# Patient Record
Sex: Female | Born: 1940 | Race: White | Hispanic: No | Marital: Married | Smoking: Former smoker
Health system: Southern US, Community
[De-identification: ages and names within clinical notes are randomized; demographics above are authoritative.]

## PROBLEM LIST (undated history)

## (undated) DIAGNOSIS — I4891 Unspecified atrial fibrillation: Secondary | ICD-10-CM

## (undated) DIAGNOSIS — J449 Chronic obstructive pulmonary disease, unspecified: Secondary | ICD-10-CM

## (undated) DIAGNOSIS — I509 Heart failure, unspecified: Secondary | ICD-10-CM

---

## 1999-03-06 ENCOUNTER — Encounter: Admission: RE | Admit: 1999-03-06 | Discharge: 1999-03-06 | Payer: Self-pay | Admitting: Obstetrics and Gynecology

## 1999-03-06 ENCOUNTER — Encounter: Payer: Self-pay | Admitting: Obstetrics and Gynecology

## 1999-03-13 ENCOUNTER — Encounter: Payer: Self-pay | Admitting: Obstetrics and Gynecology

## 1999-03-13 ENCOUNTER — Encounter: Admission: RE | Admit: 1999-03-13 | Discharge: 1999-03-13 | Payer: Self-pay | Admitting: Obstetrics and Gynecology

## 2000-03-31 ENCOUNTER — Encounter: Payer: Self-pay | Admitting: Obstetrics and Gynecology

## 2000-03-31 ENCOUNTER — Encounter: Admission: RE | Admit: 2000-03-31 | Discharge: 2000-03-31 | Payer: Self-pay | Admitting: Obstetrics and Gynecology

## 2001-06-01 ENCOUNTER — Other Ambulatory Visit: Admission: RE | Admit: 2001-06-01 | Discharge: 2001-06-01 | Payer: Self-pay | Admitting: Obstetrics and Gynecology

## 2001-06-07 ENCOUNTER — Encounter: Payer: Self-pay | Admitting: Obstetrics and Gynecology

## 2001-06-07 ENCOUNTER — Encounter: Admission: RE | Admit: 2001-06-07 | Discharge: 2001-06-07 | Payer: Self-pay | Admitting: Obstetrics and Gynecology

## 2002-08-15 ENCOUNTER — Encounter: Admission: RE | Admit: 2002-08-15 | Discharge: 2002-08-15 | Payer: Self-pay | Admitting: Unknown Physician Specialty

## 2002-08-15 ENCOUNTER — Encounter: Payer: Self-pay | Admitting: Unknown Physician Specialty

## 2004-02-14 ENCOUNTER — Encounter: Admission: RE | Admit: 2004-02-14 | Discharge: 2004-02-14 | Payer: Self-pay | Admitting: Unknown Physician Specialty

## 2005-04-08 ENCOUNTER — Encounter: Admission: RE | Admit: 2005-04-08 | Discharge: 2005-04-08 | Payer: Self-pay | Admitting: Unknown Physician Specialty

## 2006-05-20 ENCOUNTER — Encounter: Admission: RE | Admit: 2006-05-20 | Discharge: 2006-05-20 | Payer: Self-pay | Admitting: Unknown Physician Specialty

## 2007-06-21 ENCOUNTER — Encounter: Admission: RE | Admit: 2007-06-21 | Discharge: 2007-06-21 | Payer: Self-pay | Admitting: Unknown Physician Specialty

## 2008-10-24 ENCOUNTER — Encounter: Admission: RE | Admit: 2008-10-24 | Discharge: 2008-10-24 | Payer: Self-pay | Admitting: Unknown Physician Specialty

## 2009-11-13 ENCOUNTER — Encounter: Admission: RE | Admit: 2009-11-13 | Discharge: 2009-11-13 | Payer: Self-pay | Admitting: Unknown Physician Specialty

## 2010-02-11 ENCOUNTER — Encounter: Payer: Self-pay | Admitting: Unknown Physician Specialty

## 2010-12-16 ENCOUNTER — Other Ambulatory Visit: Payer: Self-pay | Admitting: Unknown Physician Specialty

## 2010-12-16 DIAGNOSIS — Z1231 Encounter for screening mammogram for malignant neoplasm of breast: Secondary | ICD-10-CM

## 2010-12-16 DIAGNOSIS — M8580 Other specified disorders of bone density and structure, unspecified site: Secondary | ICD-10-CM

## 2010-12-17 ENCOUNTER — Ambulatory Visit
Admission: RE | Admit: 2010-12-17 | Discharge: 2010-12-17 | Disposition: A | Discharge status: Home / Self Care | Payer: Self-pay | Source: Ambulatory Visit | Attending: Unknown Physician Specialty | Admitting: Unknown Physician Specialty

## 2010-12-17 DIAGNOSIS — Z1231 Encounter for screening mammogram for malignant neoplasm of breast: Secondary | ICD-10-CM

## 2010-12-24 ENCOUNTER — Ambulatory Visit
Admission: RE | Admit: 2010-12-24 | Discharge: 2010-12-24 | Disposition: A | Discharge status: Home / Self Care | Payer: Self-pay | Source: Ambulatory Visit | Attending: Unknown Physician Specialty | Admitting: Unknown Physician Specialty

## 2010-12-24 DIAGNOSIS — M8580 Other specified disorders of bone density and structure, unspecified site: Secondary | ICD-10-CM

## 2011-12-26 ENCOUNTER — Other Ambulatory Visit: Payer: Self-pay | Admitting: Unknown Physician Specialty

## 2011-12-26 DIAGNOSIS — Z1231 Encounter for screening mammogram for malignant neoplasm of breast: Secondary | ICD-10-CM

## 2011-12-30 ENCOUNTER — Ambulatory Visit (INDEPENDENT_AMBULATORY_CARE_PROVIDER_SITE_OTHER): Payer: Medicare Other

## 2011-12-30 DIAGNOSIS — R928 Other abnormal and inconclusive findings on diagnostic imaging of breast: Secondary | ICD-10-CM

## 2011-12-30 DIAGNOSIS — Z1231 Encounter for screening mammogram for malignant neoplasm of breast: Secondary | ICD-10-CM

## 2012-01-06 ENCOUNTER — Other Ambulatory Visit: Payer: Self-pay | Admitting: Unknown Physician Specialty

## 2012-01-06 DIAGNOSIS — R928 Other abnormal and inconclusive findings on diagnostic imaging of breast: Secondary | ICD-10-CM

## 2012-03-10 ENCOUNTER — Ambulatory Visit
Admission: RE | Admit: 2012-03-10 | Discharge: 2012-03-10 | Disposition: A | Discharge status: Home / Self Care | Payer: Medicare Other | Source: Ambulatory Visit | Attending: Unknown Physician Specialty | Admitting: Unknown Physician Specialty

## 2012-03-10 DIAGNOSIS — R928 Other abnormal and inconclusive findings on diagnostic imaging of breast: Secondary | ICD-10-CM

## 2013-06-29 ENCOUNTER — Other Ambulatory Visit: Payer: Self-pay | Admitting: Unknown Physician Specialty

## 2013-06-29 DIAGNOSIS — Z0000 Encounter for general adult medical examination without abnormal findings: Secondary | ICD-10-CM

## 2013-06-30 ENCOUNTER — Ambulatory Visit (INDEPENDENT_AMBULATORY_CARE_PROVIDER_SITE_OTHER): Payer: Commercial Managed Care - HMO

## 2013-06-30 DIAGNOSIS — R928 Other abnormal and inconclusive findings on diagnostic imaging of breast: Secondary | ICD-10-CM | POA: Diagnosis not present

## 2013-06-30 DIAGNOSIS — Z0000 Encounter for general adult medical examination without abnormal findings: Secondary | ICD-10-CM

## 2013-07-01 ENCOUNTER — Other Ambulatory Visit: Payer: Self-pay | Admitting: Unknown Physician Specialty

## 2013-07-01 DIAGNOSIS — R928 Other abnormal and inconclusive findings on diagnostic imaging of breast: Secondary | ICD-10-CM

## 2013-07-14 ENCOUNTER — Encounter (INDEPENDENT_AMBULATORY_CARE_PROVIDER_SITE_OTHER): Payer: Self-pay

## 2013-07-14 ENCOUNTER — Ambulatory Visit
Admission: RE | Admit: 2013-07-14 | Discharge: 2013-07-14 | Disposition: A | Discharge status: Home / Self Care | Payer: Commercial Managed Care - HMO | Source: Ambulatory Visit | Attending: Unknown Physician Specialty | Admitting: Unknown Physician Specialty

## 2013-07-14 DIAGNOSIS — R928 Other abnormal and inconclusive findings on diagnostic imaging of breast: Secondary | ICD-10-CM

## 2013-12-05 ENCOUNTER — Other Ambulatory Visit: Payer: Self-pay | Admitting: Unknown Physician Specialty

## 2013-12-05 DIAGNOSIS — R05 Cough: Secondary | ICD-10-CM

## 2013-12-05 DIAGNOSIS — R059 Cough, unspecified: Secondary | ICD-10-CM

## 2013-12-06 ENCOUNTER — Ambulatory Visit: Payer: Commercial Managed Care - HMO

## 2013-12-06 ENCOUNTER — Other Ambulatory Visit: Payer: Self-pay | Admitting: Unknown Physician Specialty

## 2013-12-06 ENCOUNTER — Ambulatory Visit (INDEPENDENT_AMBULATORY_CARE_PROVIDER_SITE_OTHER): Payer: Commercial Managed Care - HMO

## 2013-12-06 DIAGNOSIS — R059 Cough, unspecified: Secondary | ICD-10-CM

## 2013-12-06 DIAGNOSIS — R05 Cough: Secondary | ICD-10-CM

## 2013-12-06 DIAGNOSIS — R062 Wheezing: Secondary | ICD-10-CM

## 2013-12-06 DIAGNOSIS — I517 Cardiomegaly: Secondary | ICD-10-CM

## 2013-12-06 DIAGNOSIS — R0602 Shortness of breath: Secondary | ICD-10-CM

## 2014-01-26 DIAGNOSIS — J449 Chronic obstructive pulmonary disease, unspecified: Secondary | ICD-10-CM | POA: Diagnosis not present

## 2014-01-30 DIAGNOSIS — J449 Chronic obstructive pulmonary disease, unspecified: Secondary | ICD-10-CM | POA: Diagnosis not present

## 2014-02-26 DIAGNOSIS — J449 Chronic obstructive pulmonary disease, unspecified: Secondary | ICD-10-CM | POA: Diagnosis not present

## 2014-03-02 DIAGNOSIS — J449 Chronic obstructive pulmonary disease, unspecified: Secondary | ICD-10-CM | POA: Diagnosis not present

## 2014-03-22 DIAGNOSIS — I482 Chronic atrial fibrillation: Secondary | ICD-10-CM | POA: Diagnosis not present

## 2014-03-22 DIAGNOSIS — J984 Other disorders of lung: Secondary | ICD-10-CM | POA: Diagnosis not present

## 2014-03-22 DIAGNOSIS — I5033 Acute on chronic diastolic (congestive) heart failure: Secondary | ICD-10-CM | POA: Diagnosis not present

## 2014-03-22 DIAGNOSIS — N179 Acute kidney failure, unspecified: Secondary | ICD-10-CM | POA: Diagnosis not present

## 2014-03-22 DIAGNOSIS — F064 Anxiety disorder due to known physiological condition: Secondary | ICD-10-CM | POA: Diagnosis not present

## 2014-03-22 DIAGNOSIS — J449 Chronic obstructive pulmonary disease, unspecified: Secondary | ICD-10-CM | POA: Diagnosis not present

## 2014-03-22 DIAGNOSIS — Z9981 Dependence on supplemental oxygen: Secondary | ICD-10-CM | POA: Diagnosis not present

## 2014-03-22 DIAGNOSIS — I480 Paroxysmal atrial fibrillation: Secondary | ICD-10-CM | POA: Diagnosis not present

## 2014-03-22 DIAGNOSIS — Z7951 Long term (current) use of inhaled steroids: Secondary | ICD-10-CM | POA: Diagnosis not present

## 2014-03-22 DIAGNOSIS — M8580 Other specified disorders of bone density and structure, unspecified site: Secondary | ICD-10-CM | POA: Diagnosis not present

## 2014-03-22 DIAGNOSIS — R918 Other nonspecific abnormal finding of lung field: Secondary | ICD-10-CM | POA: Diagnosis not present

## 2014-03-22 DIAGNOSIS — E119 Type 2 diabetes mellitus without complications: Secondary | ICD-10-CM | POA: Diagnosis not present

## 2014-03-22 DIAGNOSIS — I10 Essential (primary) hypertension: Secondary | ICD-10-CM | POA: Diagnosis not present

## 2014-03-22 DIAGNOSIS — E785 Hyperlipidemia, unspecified: Secondary | ICD-10-CM | POA: Diagnosis not present

## 2014-03-22 DIAGNOSIS — Z79899 Other long term (current) drug therapy: Secondary | ICD-10-CM | POA: Diagnosis not present

## 2014-03-22 DIAGNOSIS — D509 Iron deficiency anemia, unspecified: Secondary | ICD-10-CM | POA: Diagnosis not present

## 2014-03-22 DIAGNOSIS — E039 Hypothyroidism, unspecified: Secondary | ICD-10-CM | POA: Diagnosis not present

## 2014-03-22 DIAGNOSIS — E11649 Type 2 diabetes mellitus with hypoglycemia without coma: Secondary | ICD-10-CM | POA: Diagnosis not present

## 2014-03-22 DIAGNOSIS — I509 Heart failure, unspecified: Secondary | ICD-10-CM | POA: Diagnosis not present

## 2014-03-22 DIAGNOSIS — J90 Pleural effusion, not elsewhere classified: Secondary | ICD-10-CM | POA: Diagnosis not present

## 2014-03-22 DIAGNOSIS — N183 Chronic kidney disease, stage 3 (moderate): Secondary | ICD-10-CM | POA: Diagnosis not present

## 2014-03-22 DIAGNOSIS — Z90710 Acquired absence of both cervix and uterus: Secondary | ICD-10-CM | POA: Diagnosis not present

## 2014-03-22 DIAGNOSIS — K219 Gastro-esophageal reflux disease without esophagitis: Secondary | ICD-10-CM | POA: Diagnosis not present

## 2014-03-22 DIAGNOSIS — R0602 Shortness of breath: Secondary | ICD-10-CM | POA: Diagnosis not present

## 2014-03-22 DIAGNOSIS — J9621 Acute and chronic respiratory failure with hypoxia: Secondary | ICD-10-CM | POA: Diagnosis not present

## 2014-03-22 DIAGNOSIS — I272 Other secondary pulmonary hypertension: Secondary | ICD-10-CM | POA: Diagnosis not present

## 2014-03-22 DIAGNOSIS — R600 Localized edema: Secondary | ICD-10-CM | POA: Diagnosis not present

## 2014-03-22 DIAGNOSIS — I4891 Unspecified atrial fibrillation: Secondary | ICD-10-CM | POA: Diagnosis not present

## 2014-03-22 DIAGNOSIS — I130 Hypertensive heart and chronic kidney disease with heart failure and stage 1 through stage 4 chronic kidney disease, or unspecified chronic kidney disease: Secondary | ICD-10-CM | POA: Diagnosis not present

## 2014-03-22 DIAGNOSIS — I071 Rheumatic tricuspid insufficiency: Secondary | ICD-10-CM | POA: Diagnosis not present

## 2014-03-22 DIAGNOSIS — J189 Pneumonia, unspecified organism: Secondary | ICD-10-CM | POA: Diagnosis not present

## 2014-03-22 DIAGNOSIS — M1990 Unspecified osteoarthritis, unspecified site: Secondary | ICD-10-CM | POA: Diagnosis not present

## 2014-03-27 DIAGNOSIS — J449 Chronic obstructive pulmonary disease, unspecified: Secondary | ICD-10-CM | POA: Diagnosis not present

## 2014-03-31 DIAGNOSIS — J449 Chronic obstructive pulmonary disease, unspecified: Secondary | ICD-10-CM | POA: Diagnosis not present

## 2014-04-04 DIAGNOSIS — N183 Chronic kidney disease, stage 3 (moderate): Secondary | ICD-10-CM | POA: Diagnosis not present

## 2014-04-04 DIAGNOSIS — I10 Essential (primary) hypertension: Secondary | ICD-10-CM | POA: Diagnosis not present

## 2014-04-04 DIAGNOSIS — E039 Hypothyroidism, unspecified: Secondary | ICD-10-CM | POA: Diagnosis not present

## 2014-04-04 DIAGNOSIS — E780 Pure hypercholesterolemia: Secondary | ICD-10-CM | POA: Diagnosis not present

## 2014-04-04 DIAGNOSIS — J449 Chronic obstructive pulmonary disease, unspecified: Secondary | ICD-10-CM | POA: Diagnosis not present

## 2014-04-04 DIAGNOSIS — E119 Type 2 diabetes mellitus without complications: Secondary | ICD-10-CM | POA: Diagnosis not present

## 2014-04-04 DIAGNOSIS — Z9981 Dependence on supplemental oxygen: Secondary | ICD-10-CM | POA: Diagnosis not present

## 2014-04-04 DIAGNOSIS — D509 Iron deficiency anemia, unspecified: Secondary | ICD-10-CM | POA: Diagnosis not present

## 2014-04-13 DIAGNOSIS — I5030 Unspecified diastolic (congestive) heart failure: Secondary | ICD-10-CM | POA: Diagnosis not present

## 2014-04-13 DIAGNOSIS — R0602 Shortness of breath: Secondary | ICD-10-CM | POA: Diagnosis not present

## 2014-04-13 DIAGNOSIS — J449 Chronic obstructive pulmonary disease, unspecified: Secondary | ICD-10-CM | POA: Diagnosis not present

## 2014-04-13 DIAGNOSIS — G4733 Obstructive sleep apnea (adult) (pediatric): Secondary | ICD-10-CM | POA: Diagnosis not present

## 2014-04-24 DIAGNOSIS — G4733 Obstructive sleep apnea (adult) (pediatric): Secondary | ICD-10-CM | POA: Diagnosis not present

## 2014-04-27 DIAGNOSIS — J449 Chronic obstructive pulmonary disease, unspecified: Secondary | ICD-10-CM | POA: Diagnosis not present

## 2014-05-01 DIAGNOSIS — J449 Chronic obstructive pulmonary disease, unspecified: Secondary | ICD-10-CM | POA: Diagnosis not present

## 2014-05-01 DIAGNOSIS — I5032 Chronic diastolic (congestive) heart failure: Secondary | ICD-10-CM | POA: Diagnosis not present

## 2014-05-01 DIAGNOSIS — I482 Chronic atrial fibrillation: Secondary | ICD-10-CM | POA: Diagnosis not present

## 2014-05-01 DIAGNOSIS — I10 Essential (primary) hypertension: Secondary | ICD-10-CM | POA: Diagnosis not present

## 2014-05-01 DIAGNOSIS — J439 Emphysema, unspecified: Secondary | ICD-10-CM | POA: Diagnosis not present

## 2014-05-08 DIAGNOSIS — G4733 Obstructive sleep apnea (adult) (pediatric): Secondary | ICD-10-CM | POA: Diagnosis not present

## 2014-05-26 DIAGNOSIS — J439 Emphysema, unspecified: Secondary | ICD-10-CM | POA: Diagnosis not present

## 2014-05-26 DIAGNOSIS — I482 Chronic atrial fibrillation: Secondary | ICD-10-CM | POA: Diagnosis not present

## 2014-05-26 DIAGNOSIS — R0602 Shortness of breath: Secondary | ICD-10-CM | POA: Diagnosis not present

## 2014-05-26 DIAGNOSIS — I10 Essential (primary) hypertension: Secondary | ICD-10-CM | POA: Diagnosis not present

## 2014-05-27 DIAGNOSIS — J449 Chronic obstructive pulmonary disease, unspecified: Secondary | ICD-10-CM | POA: Diagnosis not present

## 2014-05-31 DIAGNOSIS — J449 Chronic obstructive pulmonary disease, unspecified: Secondary | ICD-10-CM | POA: Diagnosis not present

## 2014-06-27 DIAGNOSIS — J449 Chronic obstructive pulmonary disease, unspecified: Secondary | ICD-10-CM | POA: Diagnosis not present

## 2014-07-01 DIAGNOSIS — J449 Chronic obstructive pulmonary disease, unspecified: Secondary | ICD-10-CM | POA: Diagnosis not present

## 2014-07-19 DIAGNOSIS — R911 Solitary pulmonary nodule: Secondary | ICD-10-CM | POA: Diagnosis not present

## 2014-07-27 DIAGNOSIS — J449 Chronic obstructive pulmonary disease, unspecified: Secondary | ICD-10-CM | POA: Diagnosis not present

## 2014-07-31 DIAGNOSIS — J449 Chronic obstructive pulmonary disease, unspecified: Secondary | ICD-10-CM | POA: Diagnosis not present

## 2014-08-08 DIAGNOSIS — R0602 Shortness of breath: Secondary | ICD-10-CM | POA: Diagnosis not present

## 2014-08-08 DIAGNOSIS — J449 Chronic obstructive pulmonary disease, unspecified: Secondary | ICD-10-CM | POA: Diagnosis not present

## 2014-08-08 DIAGNOSIS — D649 Anemia, unspecified: Secondary | ICD-10-CM | POA: Diagnosis not present

## 2014-08-08 DIAGNOSIS — E119 Type 2 diabetes mellitus without complications: Secondary | ICD-10-CM | POA: Diagnosis not present

## 2014-08-10 DIAGNOSIS — Z23 Encounter for immunization: Secondary | ICD-10-CM | POA: Diagnosis not present

## 2014-08-10 DIAGNOSIS — I10 Essential (primary) hypertension: Secondary | ICD-10-CM | POA: Diagnosis not present

## 2014-08-10 DIAGNOSIS — E119 Type 2 diabetes mellitus without complications: Secondary | ICD-10-CM | POA: Diagnosis not present

## 2014-08-10 DIAGNOSIS — D509 Iron deficiency anemia, unspecified: Secondary | ICD-10-CM | POA: Diagnosis not present

## 2014-08-10 DIAGNOSIS — N183 Chronic kidney disease, stage 3 (moderate): Secondary | ICD-10-CM | POA: Diagnosis not present

## 2014-08-10 DIAGNOSIS — E780 Pure hypercholesterolemia: Secondary | ICD-10-CM | POA: Diagnosis not present

## 2014-08-10 DIAGNOSIS — E039 Hypothyroidism, unspecified: Secondary | ICD-10-CM | POA: Diagnosis not present

## 2014-08-25 ENCOUNTER — Other Ambulatory Visit: Payer: Self-pay | Admitting: Unknown Physician Specialty

## 2014-08-25 DIAGNOSIS — Z1231 Encounter for screening mammogram for malignant neoplasm of breast: Secondary | ICD-10-CM

## 2014-08-27 DIAGNOSIS — J449 Chronic obstructive pulmonary disease, unspecified: Secondary | ICD-10-CM | POA: Diagnosis not present

## 2014-08-30 ENCOUNTER — Ambulatory Visit (INDEPENDENT_AMBULATORY_CARE_PROVIDER_SITE_OTHER): Payer: Medicare Other

## 2014-08-30 DIAGNOSIS — Z1231 Encounter for screening mammogram for malignant neoplasm of breast: Secondary | ICD-10-CM | POA: Diagnosis not present

## 2014-08-31 DIAGNOSIS — J449 Chronic obstructive pulmonary disease, unspecified: Secondary | ICD-10-CM | POA: Diagnosis not present

## 2014-09-08 DIAGNOSIS — D649 Anemia, unspecified: Secondary | ICD-10-CM | POA: Diagnosis not present

## 2014-09-08 DIAGNOSIS — R0602 Shortness of breath: Secondary | ICD-10-CM | POA: Diagnosis not present

## 2014-09-08 DIAGNOSIS — J449 Chronic obstructive pulmonary disease, unspecified: Secondary | ICD-10-CM | POA: Diagnosis not present

## 2014-09-08 DIAGNOSIS — E119 Type 2 diabetes mellitus without complications: Secondary | ICD-10-CM | POA: Diagnosis not present

## 2014-09-27 DIAGNOSIS — J449 Chronic obstructive pulmonary disease, unspecified: Secondary | ICD-10-CM | POA: Diagnosis not present

## 2014-10-05 DIAGNOSIS — K449 Diaphragmatic hernia without obstruction or gangrene: Secondary | ICD-10-CM | POA: Diagnosis not present

## 2014-10-05 DIAGNOSIS — R590 Localized enlarged lymph nodes: Secondary | ICD-10-CM | POA: Diagnosis not present

## 2014-10-05 DIAGNOSIS — R911 Solitary pulmonary nodule: Secondary | ICD-10-CM | POA: Diagnosis not present

## 2014-10-05 DIAGNOSIS — J8489 Other specified interstitial pulmonary diseases: Secondary | ICD-10-CM | POA: Diagnosis not present

## 2014-10-09 DIAGNOSIS — J449 Chronic obstructive pulmonary disease, unspecified: Secondary | ICD-10-CM | POA: Diagnosis not present

## 2014-10-09 DIAGNOSIS — E119 Type 2 diabetes mellitus without complications: Secondary | ICD-10-CM | POA: Diagnosis not present

## 2014-10-09 DIAGNOSIS — D649 Anemia, unspecified: Secondary | ICD-10-CM | POA: Diagnosis not present

## 2014-10-09 DIAGNOSIS — R0602 Shortness of breath: Secondary | ICD-10-CM | POA: Diagnosis not present

## 2014-10-12 DIAGNOSIS — J449 Chronic obstructive pulmonary disease, unspecified: Secondary | ICD-10-CM | POA: Diagnosis not present

## 2014-10-12 DIAGNOSIS — R911 Solitary pulmonary nodule: Secondary | ICD-10-CM | POA: Diagnosis not present

## 2014-10-12 DIAGNOSIS — G4733 Obstructive sleep apnea (adult) (pediatric): Secondary | ICD-10-CM | POA: Diagnosis not present

## 2014-10-12 DIAGNOSIS — R0902 Hypoxemia: Secondary | ICD-10-CM | POA: Diagnosis not present

## 2014-10-27 DIAGNOSIS — J449 Chronic obstructive pulmonary disease, unspecified: Secondary | ICD-10-CM | POA: Diagnosis not present

## 2014-11-01 DIAGNOSIS — I5032 Chronic diastolic (congestive) heart failure: Secondary | ICD-10-CM | POA: Diagnosis not present

## 2014-11-01 DIAGNOSIS — J439 Emphysema, unspecified: Secondary | ICD-10-CM | POA: Diagnosis not present

## 2014-11-01 DIAGNOSIS — I482 Chronic atrial fibrillation: Secondary | ICD-10-CM | POA: Diagnosis not present

## 2014-11-01 DIAGNOSIS — I10 Essential (primary) hypertension: Secondary | ICD-10-CM | POA: Diagnosis not present

## 2014-11-27 DIAGNOSIS — J449 Chronic obstructive pulmonary disease, unspecified: Secondary | ICD-10-CM | POA: Diagnosis not present

## 2014-11-30 DIAGNOSIS — J441 Chronic obstructive pulmonary disease with (acute) exacerbation: Secondary | ICD-10-CM | POA: Diagnosis not present

## 2014-11-30 DIAGNOSIS — R0902 Hypoxemia: Secondary | ICD-10-CM | POA: Diagnosis not present

## 2014-11-30 DIAGNOSIS — G4733 Obstructive sleep apnea (adult) (pediatric): Secondary | ICD-10-CM | POA: Diagnosis not present

## 2014-11-30 DIAGNOSIS — I4891 Unspecified atrial fibrillation: Secondary | ICD-10-CM | POA: Diagnosis not present

## 2014-12-01 DIAGNOSIS — Z0000 Encounter for general adult medical examination without abnormal findings: Secondary | ICD-10-CM | POA: Diagnosis not present

## 2014-12-01 DIAGNOSIS — E039 Hypothyroidism, unspecified: Secondary | ICD-10-CM | POA: Diagnosis not present

## 2014-12-01 DIAGNOSIS — R828 Abnormal findings on cytological and histological examination of urine: Secondary | ICD-10-CM | POA: Diagnosis not present

## 2014-12-01 DIAGNOSIS — M899 Disorder of bone, unspecified: Secondary | ICD-10-CM | POA: Diagnosis not present

## 2014-12-01 DIAGNOSIS — Z862 Personal history of diseases of the blood and blood-forming organs and certain disorders involving the immune mechanism: Secondary | ICD-10-CM | POA: Diagnosis not present

## 2014-12-01 DIAGNOSIS — E7800 Pure hypercholesterolemia, unspecified: Secondary | ICD-10-CM | POA: Diagnosis not present

## 2014-12-01 DIAGNOSIS — I10 Essential (primary) hypertension: Secondary | ICD-10-CM | POA: Diagnosis not present

## 2014-12-01 DIAGNOSIS — Z23 Encounter for immunization: Secondary | ICD-10-CM | POA: Diagnosis not present

## 2014-12-01 DIAGNOSIS — M8580 Other specified disorders of bone density and structure, unspecified site: Secondary | ICD-10-CM | POA: Diagnosis not present

## 2014-12-19 DIAGNOSIS — J439 Emphysema, unspecified: Secondary | ICD-10-CM | POA: Diagnosis not present

## 2014-12-19 DIAGNOSIS — K5900 Constipation, unspecified: Secondary | ICD-10-CM | POA: Diagnosis not present

## 2014-12-19 DIAGNOSIS — I480 Paroxysmal atrial fibrillation: Secondary | ICD-10-CM | POA: Diagnosis not present

## 2014-12-27 DIAGNOSIS — J449 Chronic obstructive pulmonary disease, unspecified: Secondary | ICD-10-CM | POA: Diagnosis not present

## 2015-01-01 DIAGNOSIS — Z1212 Encounter for screening for malignant neoplasm of rectum: Secondary | ICD-10-CM | POA: Diagnosis not present

## 2015-01-01 DIAGNOSIS — Z1211 Encounter for screening for malignant neoplasm of colon: Secondary | ICD-10-CM | POA: Diagnosis not present

## 2015-01-19 DIAGNOSIS — J439 Emphysema, unspecified: Secondary | ICD-10-CM | POA: Diagnosis not present

## 2015-01-19 DIAGNOSIS — K5900 Constipation, unspecified: Secondary | ICD-10-CM | POA: Diagnosis not present

## 2015-01-19 DIAGNOSIS — I480 Paroxysmal atrial fibrillation: Secondary | ICD-10-CM | POA: Diagnosis not present

## 2015-01-19 DIAGNOSIS — R195 Other fecal abnormalities: Secondary | ICD-10-CM | POA: Diagnosis not present

## 2015-01-27 DIAGNOSIS — J449 Chronic obstructive pulmonary disease, unspecified: Secondary | ICD-10-CM | POA: Diagnosis not present

## 2015-01-30 DIAGNOSIS — Z780 Asymptomatic menopausal state: Secondary | ICD-10-CM | POA: Diagnosis not present

## 2015-02-09 DIAGNOSIS — D126 Benign neoplasm of colon, unspecified: Secondary | ICD-10-CM | POA: Diagnosis not present

## 2015-02-09 DIAGNOSIS — Z87891 Personal history of nicotine dependence: Secondary | ICD-10-CM | POA: Diagnosis not present

## 2015-02-09 DIAGNOSIS — E039 Hypothyroidism, unspecified: Secondary | ICD-10-CM | POA: Diagnosis not present

## 2015-02-09 DIAGNOSIS — I082 Rheumatic disorders of both aortic and tricuspid valves: Secondary | ICD-10-CM | POA: Diagnosis not present

## 2015-02-09 DIAGNOSIS — E785 Hyperlipidemia, unspecified: Secondary | ICD-10-CM | POA: Diagnosis not present

## 2015-02-09 DIAGNOSIS — D120 Benign neoplasm of cecum: Secondary | ICD-10-CM | POA: Diagnosis not present

## 2015-02-09 DIAGNOSIS — Z7951 Long term (current) use of inhaled steroids: Secondary | ICD-10-CM | POA: Diagnosis not present

## 2015-02-09 DIAGNOSIS — I4891 Unspecified atrial fibrillation: Secondary | ICD-10-CM | POA: Diagnosis not present

## 2015-02-09 DIAGNOSIS — K5730 Diverticulosis of large intestine without perforation or abscess without bleeding: Secondary | ICD-10-CM | POA: Diagnosis not present

## 2015-02-09 DIAGNOSIS — Z7901 Long term (current) use of anticoagulants: Secondary | ICD-10-CM | POA: Diagnosis not present

## 2015-02-09 DIAGNOSIS — I509 Heart failure, unspecified: Secondary | ICD-10-CM | POA: Diagnosis not present

## 2015-02-09 DIAGNOSIS — I10 Essential (primary) hypertension: Secondary | ICD-10-CM | POA: Diagnosis not present

## 2015-02-09 DIAGNOSIS — J449 Chronic obstructive pulmonary disease, unspecified: Secondary | ICD-10-CM | POA: Diagnosis not present

## 2015-02-09 DIAGNOSIS — K219 Gastro-esophageal reflux disease without esophagitis: Secondary | ICD-10-CM | POA: Diagnosis not present

## 2015-02-09 DIAGNOSIS — Z1211 Encounter for screening for malignant neoplasm of colon: Secondary | ICD-10-CM | POA: Diagnosis not present

## 2015-02-09 DIAGNOSIS — E119 Type 2 diabetes mellitus without complications: Secondary | ICD-10-CM | POA: Diagnosis not present

## 2015-02-09 DIAGNOSIS — K5900 Constipation, unspecified: Secondary | ICD-10-CM | POA: Diagnosis not present

## 2015-02-09 DIAGNOSIS — J439 Emphysema, unspecified: Secondary | ICD-10-CM | POA: Diagnosis not present

## 2015-02-13 DIAGNOSIS — J440 Chronic obstructive pulmonary disease with acute lower respiratory infection: Secondary | ICD-10-CM | POA: Diagnosis not present

## 2015-02-13 DIAGNOSIS — I110 Hypertensive heart disease with heart failure: Secondary | ICD-10-CM | POA: Diagnosis not present

## 2015-02-13 DIAGNOSIS — Z7951 Long term (current) use of inhaled steroids: Secondary | ICD-10-CM | POA: Diagnosis not present

## 2015-02-13 DIAGNOSIS — I2781 Cor pulmonale (chronic): Secondary | ICD-10-CM | POA: Diagnosis not present

## 2015-02-13 DIAGNOSIS — I5032 Chronic diastolic (congestive) heart failure: Secondary | ICD-10-CM | POA: Diagnosis not present

## 2015-02-13 DIAGNOSIS — Z9989 Dependence on other enabling machines and devices: Secondary | ICD-10-CM | POA: Diagnosis not present

## 2015-02-13 DIAGNOSIS — J441 Chronic obstructive pulmonary disease with (acute) exacerbation: Secondary | ICD-10-CM | POA: Diagnosis not present

## 2015-02-13 DIAGNOSIS — E039 Hypothyroidism, unspecified: Secondary | ICD-10-CM | POA: Diagnosis not present

## 2015-02-13 DIAGNOSIS — M6281 Muscle weakness (generalized): Secondary | ICD-10-CM | POA: Diagnosis not present

## 2015-02-13 DIAGNOSIS — J189 Pneumonia, unspecified organism: Secondary | ICD-10-CM | POA: Diagnosis not present

## 2015-02-13 DIAGNOSIS — Z79899 Other long term (current) drug therapy: Secondary | ICD-10-CM | POA: Diagnosis not present

## 2015-02-13 DIAGNOSIS — K219 Gastro-esophageal reflux disease without esophagitis: Secondary | ICD-10-CM | POA: Diagnosis not present

## 2015-02-13 DIAGNOSIS — I4891 Unspecified atrial fibrillation: Secondary | ICD-10-CM | POA: Diagnosis not present

## 2015-02-13 DIAGNOSIS — J811 Chronic pulmonary edema: Secondary | ICD-10-CM | POA: Diagnosis not present

## 2015-02-13 DIAGNOSIS — R918 Other nonspecific abnormal finding of lung field: Secondary | ICD-10-CM | POA: Diagnosis not present

## 2015-02-13 DIAGNOSIS — Z87891 Personal history of nicotine dependence: Secondary | ICD-10-CM | POA: Diagnosis not present

## 2015-02-13 DIAGNOSIS — J9622 Acute and chronic respiratory failure with hypercapnia: Secondary | ICD-10-CM | POA: Diagnosis not present

## 2015-02-13 DIAGNOSIS — D509 Iron deficiency anemia, unspecified: Secondary | ICD-10-CM | POA: Diagnosis not present

## 2015-02-13 DIAGNOSIS — J449 Chronic obstructive pulmonary disease, unspecified: Secondary | ICD-10-CM | POA: Diagnosis not present

## 2015-02-13 DIAGNOSIS — I509 Heart failure, unspecified: Secondary | ICD-10-CM | POA: Diagnosis not present

## 2015-02-13 DIAGNOSIS — I272 Other secondary pulmonary hypertension: Secondary | ICD-10-CM | POA: Diagnosis not present

## 2015-02-13 DIAGNOSIS — G4733 Obstructive sleep apnea (adult) (pediatric): Secondary | ICD-10-CM | POA: Diagnosis not present

## 2015-02-13 DIAGNOSIS — R0602 Shortness of breath: Secondary | ICD-10-CM | POA: Diagnosis not present

## 2015-02-13 DIAGNOSIS — G729 Myopathy, unspecified: Secondary | ICD-10-CM | POA: Diagnosis not present

## 2015-02-13 DIAGNOSIS — J9602 Acute respiratory failure with hypercapnia: Secondary | ICD-10-CM | POA: Diagnosis not present

## 2015-02-13 DIAGNOSIS — I5021 Acute systolic (congestive) heart failure: Secondary | ICD-10-CM | POA: Diagnosis not present

## 2015-02-13 DIAGNOSIS — J9621 Acute and chronic respiratory failure with hypoxia: Secondary | ICD-10-CM | POA: Diagnosis not present

## 2015-02-13 DIAGNOSIS — E871 Hypo-osmolality and hyponatremia: Secondary | ICD-10-CM | POA: Diagnosis not present

## 2015-02-13 DIAGNOSIS — Z9981 Dependence on supplemental oxygen: Secondary | ICD-10-CM | POA: Diagnosis not present

## 2015-02-13 DIAGNOSIS — K5900 Constipation, unspecified: Secondary | ICD-10-CM | POA: Diagnosis not present

## 2015-02-13 DIAGNOSIS — N179 Acute kidney failure, unspecified: Secondary | ICD-10-CM | POA: Diagnosis not present

## 2015-02-13 DIAGNOSIS — D649 Anemia, unspecified: Secondary | ICD-10-CM | POA: Diagnosis not present

## 2015-02-13 DIAGNOSIS — I071 Rheumatic tricuspid insufficiency: Secondary | ICD-10-CM | POA: Diagnosis not present

## 2015-02-13 DIAGNOSIS — E1165 Type 2 diabetes mellitus with hyperglycemia: Secondary | ICD-10-CM | POA: Diagnosis not present

## 2015-02-13 DIAGNOSIS — E119 Type 2 diabetes mellitus without complications: Secondary | ICD-10-CM | POA: Diagnosis not present

## 2015-02-13 DIAGNOSIS — R0600 Dyspnea, unspecified: Secondary | ICD-10-CM | POA: Diagnosis not present

## 2015-02-13 DIAGNOSIS — I517 Cardiomegaly: Secondary | ICD-10-CM | POA: Diagnosis not present

## 2015-03-04 DIAGNOSIS — I10 Essential (primary) hypertension: Secondary | ICD-10-CM | POA: Diagnosis not present

## 2015-03-04 DIAGNOSIS — E876 Hypokalemia: Secondary | ICD-10-CM | POA: Diagnosis not present

## 2015-03-04 DIAGNOSIS — I482 Chronic atrial fibrillation: Secondary | ICD-10-CM | POA: Diagnosis not present

## 2015-03-04 DIAGNOSIS — A419 Sepsis, unspecified organism: Secondary | ICD-10-CM | POA: Diagnosis not present

## 2015-03-04 DIAGNOSIS — E119 Type 2 diabetes mellitus without complications: Secondary | ICD-10-CM | POA: Diagnosis not present

## 2015-03-04 DIAGNOSIS — Y95 Nosocomial condition: Secondary | ICD-10-CM | POA: Diagnosis present

## 2015-03-04 DIAGNOSIS — Z9981 Dependence on supplemental oxygen: Secondary | ICD-10-CM | POA: Diagnosis not present

## 2015-03-04 DIAGNOSIS — G4733 Obstructive sleep apnea (adult) (pediatric): Secondary | ICD-10-CM | POA: Diagnosis not present

## 2015-03-04 DIAGNOSIS — I5033 Acute on chronic diastolic (congestive) heart failure: Secondary | ICD-10-CM | POA: Diagnosis not present

## 2015-03-04 DIAGNOSIS — L8992 Pressure ulcer of unspecified site, stage 2: Secondary | ICD-10-CM | POA: Diagnosis not present

## 2015-03-04 DIAGNOSIS — I4891 Unspecified atrial fibrillation: Secondary | ICD-10-CM | POA: Diagnosis not present

## 2015-03-04 DIAGNOSIS — J440 Chronic obstructive pulmonary disease with acute lower respiratory infection: Secondary | ICD-10-CM | POA: Diagnosis not present

## 2015-03-04 DIAGNOSIS — Z7951 Long term (current) use of inhaled steroids: Secondary | ICD-10-CM | POA: Diagnosis not present

## 2015-03-04 DIAGNOSIS — D638 Anemia in other chronic diseases classified elsewhere: Secondary | ICD-10-CM | POA: Diagnosis not present

## 2015-03-04 DIAGNOSIS — Z9989 Dependence on other enabling machines and devices: Secondary | ICD-10-CM | POA: Diagnosis not present

## 2015-03-04 DIAGNOSIS — J189 Pneumonia, unspecified organism: Secondary | ICD-10-CM | POA: Diagnosis not present

## 2015-03-04 DIAGNOSIS — D509 Iron deficiency anemia, unspecified: Secondary | ICD-10-CM | POA: Diagnosis not present

## 2015-03-04 DIAGNOSIS — I509 Heart failure, unspecified: Secondary | ICD-10-CM | POA: Diagnosis not present

## 2015-03-04 DIAGNOSIS — J9621 Acute and chronic respiratory failure with hypoxia: Secondary | ICD-10-CM | POA: Diagnosis not present

## 2015-03-04 DIAGNOSIS — I5031 Acute diastolic (congestive) heart failure: Secondary | ICD-10-CM | POA: Diagnosis not present

## 2015-03-04 DIAGNOSIS — Z7901 Long term (current) use of anticoagulants: Secondary | ICD-10-CM | POA: Diagnosis not present

## 2015-03-04 DIAGNOSIS — J449 Chronic obstructive pulmonary disease, unspecified: Secondary | ICD-10-CM | POA: Diagnosis not present

## 2015-03-04 DIAGNOSIS — Z66 Do not resuscitate: Secondary | ICD-10-CM | POA: Diagnosis not present

## 2015-03-04 DIAGNOSIS — E039 Hypothyroidism, unspecified: Secondary | ICD-10-CM | POA: Diagnosis not present

## 2015-03-04 DIAGNOSIS — Z8249 Family history of ischemic heart disease and other diseases of the circulatory system: Secondary | ICD-10-CM | POA: Diagnosis not present

## 2015-03-04 DIAGNOSIS — J9622 Acute and chronic respiratory failure with hypercapnia: Secondary | ICD-10-CM | POA: Diagnosis not present

## 2015-03-04 DIAGNOSIS — D649 Anemia, unspecified: Secondary | ICD-10-CM | POA: Diagnosis not present

## 2015-03-04 DIAGNOSIS — J441 Chronic obstructive pulmonary disease with (acute) exacerbation: Secondary | ICD-10-CM | POA: Diagnosis not present

## 2015-03-04 DIAGNOSIS — I5032 Chronic diastolic (congestive) heart failure: Secondary | ICD-10-CM | POA: Diagnosis not present

## 2015-03-04 DIAGNOSIS — Z79899 Other long term (current) drug therapy: Secondary | ICD-10-CM | POA: Diagnosis not present

## 2015-03-04 DIAGNOSIS — J439 Emphysema, unspecified: Secondary | ICD-10-CM | POA: Diagnosis not present

## 2015-03-04 DIAGNOSIS — M6281 Muscle weakness (generalized): Secondary | ICD-10-CM | POA: Diagnosis not present

## 2015-03-04 DIAGNOSIS — Z9119 Patient's noncompliance with other medical treatment and regimen: Secondary | ICD-10-CM | POA: Diagnosis not present

## 2015-03-04 DIAGNOSIS — R0602 Shortness of breath: Secondary | ICD-10-CM | POA: Diagnosis not present

## 2015-03-04 DIAGNOSIS — N179 Acute kidney failure, unspecified: Secondary | ICD-10-CM | POA: Diagnosis not present

## 2015-03-05 DIAGNOSIS — I5032 Chronic diastolic (congestive) heart failure: Secondary | ICD-10-CM | POA: Diagnosis not present

## 2015-03-05 DIAGNOSIS — E119 Type 2 diabetes mellitus without complications: Secondary | ICD-10-CM | POA: Diagnosis not present

## 2015-03-05 DIAGNOSIS — J449 Chronic obstructive pulmonary disease, unspecified: Secondary | ICD-10-CM | POA: Diagnosis not present

## 2015-03-05 DIAGNOSIS — I4891 Unspecified atrial fibrillation: Secondary | ICD-10-CM | POA: Diagnosis not present

## 2015-03-15 ENCOUNTER — Emergency Department (HOSPITAL_COMMUNITY): Payer: Medicare Other

## 2015-03-15 ENCOUNTER — Inpatient Hospital Stay (HOSPITAL_COMMUNITY)
Admission: EM | Admit: 2015-03-15 | Discharge: 2015-03-22 | DRG: 871 | Disposition: A | Discharge status: Home / Self Care | Payer: Medicare Other | Attending: Internal Medicine | Admitting: Internal Medicine

## 2015-03-15 ENCOUNTER — Encounter (HOSPITAL_COMMUNITY): Payer: Self-pay | Admitting: Emergency Medicine

## 2015-03-15 DIAGNOSIS — I509 Heart failure, unspecified: Secondary | ICD-10-CM

## 2015-03-15 DIAGNOSIS — A419 Sepsis, unspecified organism: Principal | ICD-10-CM | POA: Diagnosis present

## 2015-03-15 DIAGNOSIS — L8992 Pressure ulcer of unspecified site, stage 2: Secondary | ICD-10-CM | POA: Diagnosis not present

## 2015-03-15 DIAGNOSIS — I4891 Unspecified atrial fibrillation: Secondary | ICD-10-CM | POA: Diagnosis present

## 2015-03-15 DIAGNOSIS — Y95 Nosocomial condition: Secondary | ICD-10-CM | POA: Diagnosis present

## 2015-03-15 DIAGNOSIS — D649 Anemia, unspecified: Secondary | ICD-10-CM

## 2015-03-15 DIAGNOSIS — J439 Emphysema, unspecified: Secondary | ICD-10-CM

## 2015-03-15 DIAGNOSIS — K709 Alcoholic liver disease, unspecified: Secondary | ICD-10-CM | POA: Insufficient documentation

## 2015-03-15 DIAGNOSIS — Z7951 Long term (current) use of inhaled steroids: Secondary | ICD-10-CM | POA: Diagnosis not present

## 2015-03-15 DIAGNOSIS — Z66 Do not resuscitate: Secondary | ICD-10-CM | POA: Diagnosis not present

## 2015-03-15 DIAGNOSIS — J9621 Acute and chronic respiratory failure with hypoxia: Secondary | ICD-10-CM | POA: Diagnosis present

## 2015-03-15 DIAGNOSIS — I5033 Acute on chronic diastolic (congestive) heart failure: Secondary | ICD-10-CM | POA: Diagnosis not present

## 2015-03-15 DIAGNOSIS — D638 Anemia in other chronic diseases classified elsewhere: Secondary | ICD-10-CM | POA: Diagnosis not present

## 2015-03-15 DIAGNOSIS — J189 Pneumonia, unspecified organism: Secondary | ICD-10-CM

## 2015-03-15 DIAGNOSIS — J440 Chronic obstructive pulmonary disease with acute lower respiratory infection: Secondary | ICD-10-CM | POA: Diagnosis not present

## 2015-03-15 DIAGNOSIS — I10 Essential (primary) hypertension: Secondary | ICD-10-CM | POA: Diagnosis present

## 2015-03-15 DIAGNOSIS — Z7901 Long term (current) use of anticoagulants: Secondary | ICD-10-CM

## 2015-03-15 DIAGNOSIS — I482 Chronic atrial fibrillation: Secondary | ICD-10-CM

## 2015-03-15 DIAGNOSIS — N179 Acute kidney failure, unspecified: Secondary | ICD-10-CM | POA: Diagnosis not present

## 2015-03-15 DIAGNOSIS — G4733 Obstructive sleep apnea (adult) (pediatric): Secondary | ICD-10-CM | POA: Diagnosis not present

## 2015-03-15 DIAGNOSIS — N289 Disorder of kidney and ureter, unspecified: Secondary | ICD-10-CM | POA: Diagnosis not present

## 2015-03-15 DIAGNOSIS — Z86711 Personal history of pulmonary embolism: Secondary | ICD-10-CM | POA: Insufficient documentation

## 2015-03-15 DIAGNOSIS — Z9981 Dependence on supplemental oxygen: Secondary | ICD-10-CM | POA: Diagnosis not present

## 2015-03-15 DIAGNOSIS — J449 Chronic obstructive pulmonary disease, unspecified: Secondary | ICD-10-CM

## 2015-03-15 DIAGNOSIS — I5031 Acute diastolic (congestive) heart failure: Secondary | ICD-10-CM

## 2015-03-15 DIAGNOSIS — J9620 Acute and chronic respiratory failure, unspecified whether with hypoxia or hypercapnia: Secondary | ICD-10-CM

## 2015-03-15 DIAGNOSIS — R0602 Shortness of breath: Secondary | ICD-10-CM

## 2015-03-15 DIAGNOSIS — Z8249 Family history of ischemic heart disease and other diseases of the circulatory system: Secondary | ICD-10-CM

## 2015-03-15 DIAGNOSIS — Z9119 Patient's noncompliance with other medical treatment and regimen: Secondary | ICD-10-CM

## 2015-03-15 DIAGNOSIS — Z79899 Other long term (current) drug therapy: Secondary | ICD-10-CM | POA: Diagnosis not present

## 2015-03-15 DIAGNOSIS — E876 Hypokalemia: Secondary | ICD-10-CM | POA: Diagnosis not present

## 2015-03-15 DIAGNOSIS — L8990 Pressure ulcer of unspecified site, unspecified stage: Secondary | ICD-10-CM | POA: Insufficient documentation

## 2015-03-15 HISTORY — DX: Chronic obstructive pulmonary disease, unspecified: J44.9

## 2015-03-15 HISTORY — DX: Heart failure, unspecified: I50.9

## 2015-03-15 HISTORY — DX: Unspecified atrial fibrillation: I48.91

## 2015-03-15 LAB — I-STAT ARTERIAL BLOOD GAS, ED
Acid-Base Excess: 6 mmol/L — ABNORMAL HIGH (ref 0.0–2.0)
BICARBONATE: 29.8 meq/L — AB (ref 20.0–24.0)
O2 Saturation: 100 %
PO2 ART: 174 mmHg — AB (ref 80.0–100.0)
TCO2: 31 mmol/L (ref 0–100)
pCO2 arterial: 40.5 mmHg (ref 35.0–45.0)
pH, Arterial: 7.474 — ABNORMAL HIGH (ref 7.350–7.450)

## 2015-03-15 LAB — CBC WITH DIFFERENTIAL/PLATELET
BASOS ABS: 0 10*3/uL (ref 0.0–0.1)
Basophils Relative: 0 %
Eosinophils Absolute: 0.1 10*3/uL (ref 0.0–0.7)
Eosinophils Relative: 1 %
HCT: 25.7 % — ABNORMAL LOW (ref 36.0–46.0)
Hemoglobin: 7.9 g/dL — ABNORMAL LOW (ref 12.0–15.0)
LYMPHS ABS: 0.4 10*3/uL — AB (ref 0.7–4.0)
LYMPHS PCT: 7 %
MCH: 24.3 pg — ABNORMAL LOW (ref 26.0–34.0)
MCHC: 30.7 g/dL (ref 30.0–36.0)
MCV: 79.1 fL (ref 78.0–100.0)
MONO ABS: 0.3 10*3/uL (ref 0.1–1.0)
Monocytes Relative: 5 %
Neutro Abs: 5.1 10*3/uL (ref 1.7–7.7)
Neutrophils Relative %: 87 %
PLATELETS: 278 10*3/uL (ref 150–400)
RBC: 3.25 MIL/uL — AB (ref 3.87–5.11)
RDW: 24.1 % — AB (ref 11.5–15.5)
WBC: 5.9 10*3/uL (ref 4.0–10.5)

## 2015-03-15 LAB — COMPREHENSIVE METABOLIC PANEL
ALBUMIN: 2.7 g/dL — AB (ref 3.5–5.0)
ALT: 17 U/L (ref 14–54)
AST: 19 U/L (ref 15–41)
Alkaline Phosphatase: 48 U/L (ref 38–126)
Anion gap: 11 (ref 5–15)
BUN: 31 mg/dL — AB (ref 6–20)
CHLORIDE: 97 mmol/L — AB (ref 101–111)
CO2: 27 mmol/L (ref 22–32)
Calcium: 8.1 mg/dL — ABNORMAL LOW (ref 8.9–10.3)
Creatinine, Ser: 1.58 mg/dL — ABNORMAL HIGH (ref 0.44–1.00)
GFR calc Af Amer: 36 mL/min — ABNORMAL LOW (ref >60)
GFR calc non Af Amer: 31 mL/min — ABNORMAL LOW (ref >60)
GLUCOSE: 110 mg/dL — AB (ref 65–99)
POTASSIUM: 4.4 mmol/L (ref 3.5–5.1)
SODIUM: 135 mmol/L (ref 135–145)
Total Bilirubin: 0.8 mg/dL (ref 0.3–1.2)
Total Protein: 5.8 g/dL — ABNORMAL LOW (ref 6.5–8.1)

## 2015-03-15 LAB — STREP PNEUMONIAE URINARY ANTIGEN: Strep Pneumo Urinary Antigen: NEGATIVE

## 2015-03-15 LAB — ABO/RH: ABO/RH(D): A POS

## 2015-03-15 LAB — I-STAT TROPONIN, ED: Troponin i, poc: 0.03 ng/mL (ref 0.00–0.08)

## 2015-03-15 LAB — PREPARE RBC (CROSSMATCH)

## 2015-03-15 LAB — INFLUENZA PANEL BY PCR (TYPE A & B)
H1N1 flu by pcr: NOT DETECTED
INFLBPCR: NEGATIVE
Influenza A By PCR: NEGATIVE

## 2015-03-15 LAB — BRAIN NATRIURETIC PEPTIDE: B NATRIURETIC PEPTIDE 5: 303.1 pg/mL — AB (ref 0.0–100.0)

## 2015-03-15 LAB — MRSA PCR SCREENING: MRSA BY PCR: NEGATIVE

## 2015-03-15 MED ORDER — TRIAMCINOLONE ACETONIDE 55 MCG/ACT NA AERO: 2.0000 | INHALATION_SPRAY | NASAL | Status: DC | PRN

## 2015-03-15 MED ORDER — OSELTAMIVIR PHOSPHATE 30 MG PO CAPS
30.0000 mg | ORAL_CAPSULE | Freq: Two times a day (BID) | ORAL | Status: DC
  Administered 2015-03-15 (×2): 30 mg via ORAL
  Filled 2015-03-15 (×4): qty 1

## 2015-03-15 MED ORDER — ATORVASTATIN CALCIUM 10 MG PO TABS: 10.0000 mg | ORAL_TABLET | Freq: Every day | ORAL | Status: DC

## 2015-03-15 MED ORDER — DEXTROSE 5 % IV SOLN
2.0000 g | INTRAVENOUS | Status: DC
  Administered 2015-03-15 – 2015-03-19 (×5): 2 g via INTRAVENOUS
  Filled 2015-03-15 (×6): qty 2

## 2015-03-15 MED ORDER — DOCUSATE SODIUM 100 MG PO CAPS: 100.0000 mg | ORAL_CAPSULE | Freq: Every day | ORAL | Status: DC | PRN

## 2015-03-15 MED ORDER — IPRATROPIUM BROMIDE 0.02 % IN SOLN
0.5000 mg | Freq: Four times a day (QID) | RESPIRATORY_TRACT | Status: DC
  Administered 2015-03-15 – 2015-03-16 (×4): 0.5 mg via RESPIRATORY_TRACT
  Filled 2015-03-15 (×5): qty 2.5

## 2015-03-15 MED ORDER — DEXTROSE 5 % IV SOLN: 2.0000 g | INTRAVENOUS | Status: DC

## 2015-03-15 MED ORDER — METOPROLOL SUCCINATE ER 100 MG PO TB24
150.0000 mg | ORAL_TABLET | Freq: Every day | ORAL | Status: DC
  Administered 2015-03-16 – 2015-03-22 (×6): 150 mg via ORAL
  Filled 2015-03-15: qty 1
  Filled 2015-03-15: qty 2
  Filled 2015-03-15 (×2): qty 1
  Filled 2015-03-15: qty 2
  Filled 2015-03-15: qty 1
  Filled 2015-03-15: qty 2

## 2015-03-15 MED ORDER — ALBUTEROL SULFATE (2.5 MG/3ML) 0.083% IN NEBU: 2.5000 mg | INHALATION_SOLUTION | RESPIRATORY_TRACT | Status: DC | PRN

## 2015-03-15 MED ORDER — FAMOTIDINE 20 MG PO TABS
20.0000 mg | ORAL_TABLET | Freq: Every day | ORAL | Status: DC
  Administered 2015-03-15 – 2015-03-22 (×8): 20 mg via ORAL
  Filled 2015-03-15 (×8): qty 1

## 2015-03-15 MED ORDER — LEVOTHYROXINE SODIUM 75 MCG PO TABS
75.0000 ug | ORAL_TABLET | Freq: Every day | ORAL | Status: DC
  Administered 2015-03-16 – 2015-03-22 (×7): 75 ug via ORAL
  Filled 2015-03-15 (×8): qty 1

## 2015-03-15 MED ORDER — VITAMIN D 1000 UNITS PO TABS
5000.0000 [IU] | ORAL_TABLET | Freq: Every day | ORAL | Status: DC
  Administered 2015-03-15 – 2015-03-16 (×2): 5000 [IU] via ORAL
  Filled 2015-03-15 (×3): qty 5

## 2015-03-15 MED ORDER — LORATADINE 10 MG PO TABS
10.0000 mg | ORAL_TABLET | Freq: Every day | ORAL | Status: DC
  Administered 2015-03-15 – 2015-03-22 (×8): 10 mg via ORAL
  Filled 2015-03-15 (×8): qty 1

## 2015-03-15 MED ORDER — FUROSEMIDE 10 MG/ML IJ SOLN
60.0000 mg | Freq: Two times a day (BID) | INTRAMUSCULAR | Status: DC
  Administered 2015-03-15 – 2015-03-19 (×8): 60 mg via INTRAVENOUS
  Filled 2015-03-15 (×8): qty 6

## 2015-03-15 MED ORDER — TIOTROPIUM BROMIDE MONOHYDRATE 18 MCG IN CAPS
18.0000 ug | ORAL_CAPSULE | Freq: Every day | RESPIRATORY_TRACT | Status: DC
Start: 2015-03-16 — End: 2015-03-22
  Administered 2015-03-16 – 2015-03-22 (×7): 18 ug via RESPIRATORY_TRACT
  Filled 2015-03-15 (×3): qty 5

## 2015-03-15 MED ORDER — ATORVASTATIN CALCIUM 10 MG PO TABS
10.0000 mg | ORAL_TABLET | Freq: Every day | ORAL | Status: DC
  Administered 2015-03-15 – 2015-03-21 (×7): 10 mg via ORAL
  Filled 2015-03-15 (×7): qty 1

## 2015-03-15 MED ORDER — VANCOMYCIN HCL IN DEXTROSE 750-5 MG/150ML-% IV SOLN
750.0000 mg | INTRAVENOUS | Status: DC
  Administered 2015-03-16 – 2015-03-19 (×4): 750 mg via INTRAVENOUS
  Filled 2015-03-15 (×5): qty 150

## 2015-03-15 MED ORDER — BUDESONIDE-FORMOTEROL FUMARATE 160-4.5 MCG/ACT IN AERO
2.0000 | INHALATION_SPRAY | Freq: Two times a day (BID) | RESPIRATORY_TRACT | Status: DC
  Administered 2015-03-16 – 2015-03-22 (×12): 2 via RESPIRATORY_TRACT
  Filled 2015-03-15 (×3): qty 6

## 2015-03-15 MED ORDER — DILTIAZEM HCL ER 180 MG PO CP24
180.0000 mg | ORAL_CAPSULE | Freq: Every day | ORAL | Status: DC
  Administered 2015-03-16: 180 mg via ORAL
  Filled 2015-03-15 (×4): qty 1

## 2015-03-15 MED ORDER — ACETAMINOPHEN 500 MG PO TABS: 500.0000 mg | ORAL_TABLET | ORAL | Status: DC | PRN

## 2015-03-15 MED ORDER — ALBUTEROL SULFATE (2.5 MG/3ML) 0.083% IN NEBU
2.5000 mg | INHALATION_SOLUTION | Freq: Four times a day (QID) | RESPIRATORY_TRACT | Status: DC
  Administered 2015-03-15 – 2015-03-16 (×4): 2.5 mg via RESPIRATORY_TRACT
  Filled 2015-03-15 (×5): qty 3

## 2015-03-15 MED ORDER — ACETAMINOPHEN 500 MG PO TABS
1000.0000 mg | ORAL_TABLET | Freq: Once | ORAL | Status: AC
  Administered 2015-03-15: 1000 mg via ORAL
  Filled 2015-03-15: qty 2

## 2015-03-15 MED ORDER — FUROSEMIDE 10 MG/ML IJ SOLN
60.0000 mg | Freq: Once | INTRAMUSCULAR | Status: AC
  Administered 2015-03-15: 60 mg via INTRAVENOUS
  Filled 2015-03-15: qty 6

## 2015-03-15 MED ORDER — POLYSACCHARIDE IRON COMPLEX 150 MG PO CAPS
150.0000 mg | ORAL_CAPSULE | Freq: Two times a day (BID) | ORAL | Status: DC
  Administered 2015-03-15 – 2015-03-22 (×14): 150 mg via ORAL
  Filled 2015-03-15 (×14): qty 1

## 2015-03-15 MED ORDER — POLYETHYLENE GLYCOL 3350 17 G PO PACK
17.0000 g | PACK | Freq: Every day | ORAL | Status: DC
Start: 2015-03-15 — End: 2015-03-22
  Administered 2015-03-15 – 2015-03-22 (×5): 17 g via ORAL
  Filled 2015-03-15 (×7): qty 1

## 2015-03-15 MED ORDER — DEXTROSE 5 % IV SOLN
1.0000 g | Freq: Three times a day (TID) | INTRAVENOUS | Status: DC
  Filled 2015-03-15 (×3): qty 1

## 2015-03-15 MED ORDER — SODIUM CHLORIDE 0.9% FLUSH
3.0000 mL | Freq: Two times a day (BID) | INTRAVENOUS | Status: DC
  Administered 2015-03-16 – 2015-03-19 (×8): 3 mL via INTRAVENOUS

## 2015-03-15 MED ORDER — MELATONIN 3 MG PO TABS
4.5000 mg | ORAL_TABLET | Freq: Every day | ORAL | Status: DC
  Administered 2015-03-15 – 2015-03-20 (×6): 4.5 mg via ORAL
  Filled 2015-03-15 (×7): qty 1.5

## 2015-03-15 MED ORDER — APIXABAN 5 MG PO TABS
5.0000 mg | ORAL_TABLET | Freq: Two times a day (BID) | ORAL | Status: DC
  Administered 2015-03-15 – 2015-03-22 (×15): 5 mg via ORAL
  Filled 2015-03-15 (×16): qty 1

## 2015-03-15 MED ORDER — VANCOMYCIN HCL 10 G IV SOLR
1500.0000 mg | Freq: Once | INTRAVENOUS | Status: AC
  Administered 2015-03-15: 1500 mg via INTRAVENOUS
  Filled 2015-03-15: qty 1500

## 2015-03-15 MED ORDER — GUAIFENESIN ER 600 MG PO TB12
600.0000 mg | ORAL_TABLET | Freq: Two times a day (BID) | ORAL | Status: DC
  Administered 2015-03-15 – 2015-03-21 (×12): 600 mg via ORAL
  Filled 2015-03-15 (×12): qty 1

## 2015-03-15 MED ORDER — SODIUM CHLORIDE 0.9 % IV SOLN
Freq: Once | INTRAVENOUS | Status: AC
  Administered 2015-03-15: 12:00:00 via INTRAVENOUS

## 2015-03-15 NOTE — ED Notes (Signed)
Attempted report 

## 2015-03-15 NOTE — ED Notes (Signed)
Attempted to wean the patient down to 4L via nasal cannula, pt desated to 88%. Pt placed back on NRB mask at 13L/min.

## 2015-03-15 NOTE — H&P (Addendum)
Patient Demographics  Brittany Snow, is a 75 y.o. female  MRN: [id number]   DOB - 02/02/1940  Admit Date - 03/15/2015  Outpatient Primary MD for the patient is No primary care provider on file.   With History of -  Past Medical History  Diagnosis Date  . COPD (chronic obstructive pulmonary disease) ([place])   . CHF (congestive heart failure) ([place])   . Atrial fibrillation ([place])       History reviewed. No pertinent past surgical history.  in for   Chief Complaint  Patient presents with  . Shortness of Breath     HPI  Brittany Snow  is a 75 y.o. female, with past medical history of COPD, on 3 L nasal cannula, history of atrial fibrillation  on Eliquis, OSA noncompliant with C Pap, diastolic CHF, chronic anemia, recent hospitalization at [facility] for respiratory failure secondary to COPD exacerbation, CHF exacerbation, and pneumonia, discharged to SNF, at facility patient was noticed to have hypoxia, as well she reports productive cough, green phlegm over the last few days, in ED patient requiring NRB, fever 101.3, no leukocytosis, his x-ray significant for multiple opacities significant for PE versus pneumonia, known baseline anemia with hemoglobin of 7.9(was 7.4 on discharge from [place] on 2/12), was an A. fib with heart rate in 110s, she reports worsening lower extremity edema over few days as well, received IV Lasix in ED, and I was called to admit.    Review of Systems    In addition to the HPI above,  Reports fever and chills No Headache, No changes with Vision or hearing, No problems swallowing food or Liquids, Denies chest pain, complains of productive cough with worsening dyspnea  No Abdominal pain, No Nausea or Vommitting, Bowel movements are regular, No Blood in stool or Urine, No dysuria, No new skin rashes or bruises, No new joints pains-aches,  No new weakness, tingling, numbness in any extremity, No recent weight gain or loss, No polyuria, polydypsia or  polyphagia, No significant Mental Stressors.  A full 10 point Review of Systems was done, except as stated above, all other Review of Systems were negative.   Social History Social History  Substance Use Topics  . Smoking status: Not on file  . Smokeless tobacco: Not on file  . Alcohol Use: Not on file   Family History Family history significant for hypertension  Prior to Admission medications   Medication Sig Start Date End Date Taking? Authorizing Provider  acetaminophen (TYLENOL) 500 MG tablet Take 500 mg by mouth every 4 (four) hours as needed for mild pain or headache.   Yes Historical Provider, MD  albuterol (PROVENTIL HFA;VENTOLIN HFA) 108 (90 Base) MCG/ACT inhaler Inhale 2 puffs into the lungs every 4 (four) hours as needed for wheezing or shortness of breath.   Yes Historical Provider, MD  apixaban (ELIQUIS) 5 MG TABS tablet Take 5 mg by mouth 2 (two) times daily.   Yes Historical Provider, MD  atorvastatin (LIPITOR) 10 MG tablet Take 10 mg by mouth daily.   Yes Historical Provider, MD  budesonide-formoterol (SYMBICORT) 160-4.5 MCG/ACT inhaler Inhale 2 puffs into the lungs 2 (two) times daily.   Yes Historical Provider, MD  cetirizine (ZYRTEC) 10 MG tablet Take 10 mg by mouth as needed for allergies.   Yes Historical Provider, MD  Cholecalciferol (VITAMIN D3) 5000 units TABS Take 5,000 Units by mouth daily.   Yes Historical Provider, MD  diltiazem (DILACOR XR) 180 MG 24 hr capsule Take  180 mg by mouth daily.   Yes Historical Provider, MD  docusate sodium (COLACE) 100 MG capsule Take 100 mg by mouth daily as needed for mild constipation.   Yes Historical Provider, MD  guaiFENesin (MUCINEX) 600 MG 12 hr tablet Take 600 mg by mouth 2 (two) times daily.   Yes Historical Provider, MD  iron polysaccharides (NIFEREX) 150 MG capsule Take 150 mg by mouth 2 (two) times daily.   Yes Historical Provider, MD  levothyroxine (SYNTHROID, LEVOTHROID) 75 MCG tablet Take 75 mcg by mouth daily  before breakfast.   Yes Historical Provider, MD  Melatonin 5 MG TABS Take 5 mg by mouth at bedtime.   Yes Historical Provider, MD  metoprolol succinate (TOPROL-XL) 100 MG 24 hr tablet Take 150 mg by mouth daily. Take with or immediately following a meal.   Yes Historical Provider, MD  polyethylene glycol (MIRALAX / GLYCOLAX) packet Take 17 g by mouth daily.   Yes Historical Provider, MD  ranitidine (ZANTAC) 300 MG tablet Take 300 mg by mouth daily.   Yes Historical Provider, MD  tiotropium (SPIRIVA) 18 MCG inhalation capsule Place 18 mcg into inhaler and inhale daily.   Yes Historical Provider, MD  torsemide (DEMADEX) 20 MG tablet Take 40 mg by mouth daily.   Yes Historical Provider, MD  triamcinolone (NASACORT ALLERGY 24HR) 55 MCG/ACT AERO nasal inhaler Place 2 sprays into the nose as needed (for allergies).   Yes Historical Provider, MD    No Known Allergies  Physical Exam  Vitals  Blood pressure 95/50, pulse 98, temperature 101.3 F (38.5 C), temperature source Rectal, resp. rate 21, SpO2 100 %.   1. General frail elderly female lying in bed in NAD,   2. Normal affect and insight, Not Suicidal or Homicidal, Awake Alert, Oriented X 3.  3. No F.N deficits, ALL C.Nerves Intact, has generalized weakness, but motor is grossly intact, Sensation intact all 4 extremities, Plantars down going.  4. Ears and Eyes appear Normal, Conjunctivae clear, PERRLA. Moist Oral Mucosa.  5. Supple Neck,+ JVD, No Carotid Bruits.  6. Symmetrical Chest wall movement, Sackrider air movement bilaterally, no wheezing, bibasilar crackles  7. Irregular irregular, No Gallops, Rubs or Murmurs, No Parasternal Heave, lower extremity edema+2-3  8. Positive Bowel Sounds, Abdomen Soft, No tenderness, No organomegaly appriciated,No rebound -guarding or rigidity.  9.  No Cyanosis, Normal Skin Turgor, No Skin Rash or Bruise.  10. [place] muscle tone,  joints appear normal , no effusions, Normal ROM.     Data  Review  CBC  Recent Labs Lab 03/15/15 0647  WBC 5.9  HGB 7.9*  HCT 25.7*  PLT 278  MCV 79.1  MCH 24.3*  MCHC 30.7  RDW 24.1*  LYMPHSABS 0.4*  MONOABS 0.3  EOSABS 0.1  BASOSABS 0.0   ------------------------------------------------------------------------------------------------------------------  Chemistries   Recent Labs Lab 03/15/15 0647  NA 135  K 4.4  CL 97*  CO2 27  GLUCOSE 110*  BUN 31*  CREATININE 1.58*  CALCIUM 8.1*  AST 19  ALT 17  ALKPHOS 48  BILITOT 0.8   ------------------------------------------------------------------------------------------------------------------ CrCl cannot be calculated (Unknown ideal weight.). ------------------------------------------------------------------------------------------------------------------ No results for input(s): TSH, T4TOTAL, T3FREE, THYROIDAB in the last 72 hours.  Invalid input(s): FREET3   Coagulation profile No results for input(s): INR, PROTIME in the last 168 hours. ------------------------------------------------------------------------------------------------------------------- No results for input(s): DDIMER in the last 72 hours. -------------------------------------------------------------------------------------------------------------------  Cardiac Enzymes No results for input(s): CKMB, TROPONINI, MYOGLOBIN in the last 168 hours.  Invalid input(s): CK ------------------------------------------------------------------------------------------------------------------  Invalid input(s): POCBNP   ---------------------------------------------------------------------------------------------------------------  Urinalysis No results found for: COLORURINE, APPEARANCEUR, LABSPEC, [place], GLUCOSEU, HGBUR, BILIRUBINUR, KETONESUR, PROTEINUR, UROBILINOGEN, NITRITE,  LEUKOCYTESUR  ----------------------------------------------------------------------------------------------------------------  Imaging results:   Dg Chest Port 1 View  03/15/2015  CLINICAL DATA:  Acute onset of worsening shortness of breath and severe generalized weakness. Initial encounter. EXAM: PORTABLE CHEST 1 VIEW COMPARISON:  Chest radiograph performed 12/06/2013 FINDINGS: The lungs are relatively well expanded. Patchy bibasilar airspace opacities may reflect pulmonary edema or pneumonia. No definite pleural effusion or pneumothorax is seen. Vascular congestion is noted. The cardiomediastinal silhouette is mildly enlarged. No acute osseous abnormalities are identified. IMPRESSION: Vascular congestion and mild cardiomegaly. Patchy bibasilar airspace opacities may reflect pulmonary edema or pneumonia. Electronically Signed   By: Garald Balding M.D.   On: 03/15/2015 07:02    My personal review of EKG: Rhythm NSR, Rate  104 /min, QTc 343 , no Acute ST changes    Assessment & Plan  Principal Problem:   Acute on chronic respiratory failure (HCC) Active Problems:   COPD (chronic obstructive pulmonary disease) (HCC)   Acute diastolic CHF (congestive heart failure) (HCC)   HCAP (healthcare-associated pneumonia)   Atrial fibrillation (HCC)   Anemia  Acute on chronic respiratory failure - Patient with baseline chronic respiratory failure on 3 L nasal cannula secondary to COPD, with a critical position secondary to acute diastolic CHF, infectious process most likely HCAP versus viral illness. - Currently requiring NRB, will obtain ABG, will M for saturation 90-93%, [place] mentation, do not anticipate need for BiPAP. - Continue with IV diuresis and treatment of HCAP.  Acute diastolic CHF - Recent echo at [facility] with EF 0000000, with diastolic dysfunction, moderate hypertension. - We'll start on IV diuresis 60 mg IV twice a day, hold home dose torsemide - Daily weights, strict ins  and outs - CHF order set on admission.  HCAP - Chest x-ray with bilateral opacities, edema versus infection, she is febrile, will start on vancomycin and cefepime, pneumonia order set on admission. - We'll check influenza panel given her fever, will start empirically on Tamiflu until influenza is ruled out.  Anemia - Anemia of chronic illness, hemoglobin at baseline, but given she is symptomatic with hypoxia, dyspnea, will transfuse 1 unit PRBC.  Atrial fibrillation - Patient is on Eliquis for anticoagulation - Resume back on Cardizem and metoprolol for heart rate control, slightly elevated in ED, the blood pressure becomes an issue may consider amiodarone and digoxin loading bolus.  Acute renal failure - Baseline creatinine 1.1, today is 1.58, will monitor closely as she is on IV diuresis.  COPD - Patient with no active wheezing, will wait ABG, continue with home medication including Spiriva, Symbicort, nebulizer treatment, and on steroid treatment currently, as her respiratory failures appears to be most due to infectious process and volume overload.  DVT Prophylaxis Eliquis  AM Labs Ordered, also please review Full Orders  Family Communication: Admission, patients condition and plan of care including tests being ordered have been discussed with the patient and Husband who indicate understanding and agree with the plan and Code Status.  Code Status : She is full code , confirmed by patient and husband(she has a DO NOT RESUSCITATE form from facility, they want to keep her full code during this hospital stay)  Likely DC to  SNF  Condition GUARDED   Time spent in minutes : 65 minutes    Sears Oran M.D on 03/15/2015 at 9:34 AM  Between 7am to 7pm -  Pager - [phone]  After 7pm go to [email or web address] - password [email or web address]  And look for the night coverage person covering me after hours  [facility] Office  [phone]

## 2015-03-15 NOTE — ED Notes (Signed)
MD at bedside. 

## 2015-03-15 NOTE — ED Notes (Signed)
Pt placed on venti mask at 8L/min. Pt O2 saturation at 92% at this time.

## 2015-03-15 NOTE — ED Notes (Signed)
Pt stating she feels anxious and that her chest is tight, EKG captured and will page admitting MD. Pt reports feeling she cant breathe, placed on NRB mask at 15L/min for the time being to calm patient. Pt repositioned and reports she feels some better.

## 2015-03-15 NOTE — ED Provider Notes (Signed)
Patient was recently diagnosed with double pneumonia. Treated for several weeks with antibiotics. She's been in rehabilitation for the last week. The past 2 days she's had increasing shortness of breath. She denies any cough. Shortness of breath is worse with lying flat and with any exertion. No fever or chills. Patient noted to be hypoxic on 4 L. She is now on a nonrebreather. She denies any chest pain. She endorses bilateral lower extremity swelling or worsening of the last few days. Patient is in no respiratory distress. She has bilateral crackles as well as 3+ pitting edema in the lower extremities. Exam is consistent with CHF exacerbation. Will start on IV Lasix and gets x-ray and labs. At this time the patient is maintaining oxygen saturations. She has any worsening respiratory status she will need to be on BiPAP.  Julianne Rice, MD 03/15/15 [phone]

## 2015-03-15 NOTE — ED Notes (Signed)
PER [facility]: Patient to ED from [facility] in [place] c/o worsened SOB x 2 days. Patient has hx of CHF, COPD, and A-Fib, is normally on 3L O2 via [place] maintaining SpO2 around 91%. Per EMS, nursing staff reports patient has been down to 88% on 4L. Patient was diagnosed with bilateral pneumonia 2 weeks ago, pt states she's finished all the medications for that. EMS VS: HR 104 A-Fib, 120/50, 96% on 13L NRB, 20g. L wrist. Patient A&O x 4, respirations equal, labored, lung sounds clear/diminished.

## 2015-03-15 NOTE — Progress Notes (Signed)
Pharmacy Antibiotic Note Brittany Snow is a 75 y.o. female admitted from SNF on 03/15/2015 with worsening SOB and concern for PNA vs CHF. Recent hospitalization at OSH for similar early this month. At that time SCr 1.1; today it is 1.58. Pharmacy has been consulted for Cefepime and vancomycin dosing.  Plan: 1. Cefepime 2 grams IV Q 24 hours starting this morning 2. Vancomycin 1500 mg IV x 1 now followed by vancomycin 750 mg IV q 24 hours starting on 2/24 3. If continued will obtain level at [facility]; goal 15 - 20 4. F/u cx data, clinical response and other pertinent labs/test and narrow abx as feasible 5. Following along with you daily  Weight: 175 lb 4.3 oz (79.5 kg)  Temp (24hrs), Avg:100.4 F (38 C), Min:99.5 F (37.5 C), Max:101.3 F (38.5 C)   Recent Labs Lab 03/15/15 0647  WBC 5.9  CREATININE 1.58*     No Known Allergies  Antimicrobials this admission: 2/23 Cefepime >>  2/23 Vancomycin >>  Dose adjustments this admission: n/a  Microbiology results: px  Thank you for allowing pharmacy to be a part of this patient's care.  Vincenza Hews, PharmD, BCPS 03/15/2015, 10:23 AM Pager: [phone]

## 2015-03-15 NOTE — ED Provider Notes (Addendum)
CSN: [id number]     Arrival date & time 03/15/15  0620 History   First MD Initiated Contact with Patient 03/15/15 [phone]     Chief Complaint  Patient presents with  . Shortness of Breath     (Consider location/radiation/quality/duration/timing/severity/associated sxs/prior Treatment) HPI..... Level V caveat for urgent need for intervention. Patient was recently diagnosed with "double pneumonia" for which she was treated with antibiotics and admitted to [facility] for three weeks and ultimately transferred  to a rehabilitation center. She has had increasing dyspnea over the last couple days along with lower extremity edema. She was hypoxic on 4 L of nasal cannula and a nonrebreather was ordered. No chest pain, fevers, sweats, chills.  Past Medical History  Diagnosis Date  . COPD (chronic obstructive pulmonary disease) ([place])   . CHF (congestive heart failure) ([place])   . Atrial fibrillation ([place])    History reviewed. No pertinent past surgical history. No family history on file. Social History  Substance Use Topics  . Smoking status: None  . Smokeless tobacco: None  . Alcohol Use: None   OB History    No data available     Review of Systems  Reason unable to perform ROS: Urgent need for intervention.      Allergies  Review of patient's allergies indicates no known allergies.  Home Medications   Prior to Admission medications   Medication Sig Start Date End Date Taking? Authorizing Provider  acetaminophen (TYLENOL) 500 MG tablet Take 500 mg by mouth every 4 (four) hours as needed for mild pain or headache.   Yes Historical Provider, MD  albuterol (PROVENTIL HFA;VENTOLIN HFA) 108 (90 Base) MCG/ACT inhaler Inhale 2 puffs into the lungs every 4 (four) hours as needed for wheezing or shortness of breath.   Yes Historical Provider, MD  apixaban (ELIQUIS) 5 MG TABS tablet Take 5 mg by mouth 2 (two) times daily.   Yes Historical Provider, MD  atorvastatin (LIPITOR) 10 MG tablet  Take 10 mg by mouth daily.   Yes Historical Provider, MD  budesonide-formoterol (SYMBICORT) 160-4.5 MCG/ACT inhaler Inhale 2 puffs into the lungs 2 (two) times daily.   Yes Historical Provider, MD  cetirizine (ZYRTEC) 10 MG tablet Take 10 mg by mouth as needed for allergies.   Yes Historical Provider, MD  Cholecalciferol (VITAMIN D3) 5000 units TABS Take 5,000 Units by mouth daily.   Yes Historical Provider, MD  diltiazem (DILACOR XR) 180 MG 24 hr capsule Take 180 mg by mouth daily.   Yes Historical Provider, MD  docusate sodium (COLACE) 100 MG capsule Take 100 mg by mouth daily as needed for mild constipation.   Yes Historical Provider, MD  guaiFENesin (MUCINEX) 600 MG 12 hr tablet Take 600 mg by mouth 2 (two) times daily.   Yes Historical Provider, MD  iron polysaccharides (NIFEREX) 150 MG capsule Take 150 mg by mouth 2 (two) times daily.   Yes Historical Provider, MD  levothyroxine (SYNTHROID, LEVOTHROID) 75 MCG tablet Take 75 mcg by mouth daily before breakfast.   Yes Historical Provider, MD  Melatonin 5 MG TABS Take 5 mg by mouth at bedtime.   Yes Historical Provider, MD  metoprolol succinate (TOPROL-XL) 100 MG 24 hr tablet Take 150 mg by mouth daily. Take with or immediately following a meal.   Yes Historical Provider, MD  polyethylene glycol (MIRALAX / GLYCOLAX) packet Take 17 g by mouth daily.   Yes Historical Provider, MD  ranitidine (ZANTAC) 300 MG tablet Take 300 mg by mouth  daily.   Yes Historical Provider, MD  tiotropium (SPIRIVA) 18 MCG inhalation capsule Place 18 mcg into inhaler and inhale daily.   Yes Historical Provider, MD  torsemide (DEMADEX) 20 MG tablet Take 40 mg by mouth daily.   Yes Historical Provider, MD  triamcinolone (NASACORT ALLERGY 24HR) 55 MCG/ACT AERO nasal inhaler Place 2 sprays into the nose as needed (for allergies).   Yes Historical Provider, MD   BP 109/57 mmHg  Pulse 104  Temp(Src) 99.5 F (37.5 C) (Oral)  Resp 28  SpO2 100% Physical Exam   Constitutional: She is oriented to person, place, and time.  Obese, no obvious respiratory distress on nonrebreather  HENT:  Head: Normocephalic and atraumatic.  Eyes: Conjunctivae and EOM are normal. Pupils are equal, round, and reactive to light.  Neck: Normal range of motion. Neck supple.  Cardiovascular: Normal rate and regular rhythm.   Pulmonary/Chest: Effort normal and breath sounds normal.  Abdominal: Soft. Bowel sounds are normal.  Musculoskeletal: Normal range of motion.  Neurological: She is alert and oriented to person, place, and time.  Skin: Skin is warm and dry.  3+ peripheral edema  Psychiatric: She has a normal mood and affect. Her behavior is normal.  Nursing note and vitals reviewed.   ED Course  Procedures (including critical care time) Labs Review Labs Reviewed  CBC WITH DIFFERENTIAL/PLATELET - Abnormal; Notable for the following:    RBC 3.25 (*)    Hemoglobin 7.9 (*)    HCT 25.7 (*)    MCH 24.3 (*)    RDW 24.1 (*)    All other components within normal limits  COMPREHENSIVE METABOLIC PANEL - Abnormal; Notable for the following:    Chloride 97 (*)    Glucose, Bld 110 (*)    BUN 31 (*)    Creatinine, Ser 1.58 (*)    Calcium 8.1 (*)    Total Protein 5.8 (*)    Albumin 2.7 (*)    GFR calc non Af Amer 31 (*)    GFR calc Af Amer 36 (*)    All other components within normal limits  BRAIN NATRIURETIC PEPTIDE  I-STAT TROPOININ, ED    Imaging Review Dg Chest Port 1 View  03/15/2015  CLINICAL DATA:  Acute onset of worsening shortness of breath and severe generalized weakness. Initial encounter. EXAM: PORTABLE CHEST 1 VIEW COMPARISON:  Chest radiograph performed 12/06/2013 FINDINGS: The lungs are relatively well expanded. Patchy bibasilar airspace opacities may reflect pulmonary edema or pneumonia. No definite pleural effusion or pneumothorax is seen. Vascular congestion is noted. The cardiomediastinal silhouette is mildly enlarged. No acute osseous  abnormalities are identified. IMPRESSION: Vascular congestion and mild cardiomegaly. Patchy bibasilar airspace opacities may reflect pulmonary edema or pneumonia. Electronically Signed   By: Garald Balding M.D.   On: 03/15/2015 07:02   I have personally reviewed and evaluated these images and lab results as part of my medical decision-making.   EKG Interpretation None     CRITICAL CARE Performed by: Nat Christen Total critical care time: 30 minutes Critical care time was exclusive of separately billable procedures and treating other patients. Critical care was necessary to treat or prevent imminent or life-threatening deterioration. Critical care was time spent personally by me on the following activities: development of treatment plan with patient and/or surrogate as well as nursing, discussions with consultants, evaluation of patient's response to treatment, examination of patient, obtaining history from patient or surrogate, ordering and performing treatments and interventions, ordering and review of laboratory studies, ordering  and review of radiographic studies, pulse oximetry and re-evaluation of patient's condition. MDM   Final diagnoses:  Acute congestive heart failure, unspecified congestive heart failure type ([place])    Chest x-ray reviewed. Radiologist comments on pulmonary edema versus pneumonia. White count is normal which would suggest edema over infection. IV Lasix ordered. Will admit.    Nat Christen, MD 03/15/15 [id number]  Nat Christen, MD 03/15/15 [phone]

## 2015-03-15 NOTE — ED Notes (Signed)
Patient has bed sores.  They hurt right now.  Does not want to be repositioned right now.

## 2015-03-16 ENCOUNTER — Encounter (HOSPITAL_COMMUNITY): Payer: Self-pay | Admitting: *Deleted

## 2015-03-16 DIAGNOSIS — L8990 Pressure ulcer of unspecified site, unspecified stage: Secondary | ICD-10-CM | POA: Insufficient documentation

## 2015-03-16 LAB — TYPE AND SCREEN
ABO/RH(D): A POS
ANTIBODY SCREEN: NEGATIVE
UNIT DIVISION: 0

## 2015-03-16 LAB — CBC
HCT: 27.6 % — ABNORMAL LOW (ref 36.0–46.0)
Hemoglobin: 8.6 g/dL — ABNORMAL LOW (ref 12.0–15.0)
MCH: 25.3 pg — ABNORMAL LOW (ref 26.0–34.0)
MCHC: 31.2 g/dL (ref 30.0–36.0)
MCV: 81.2 fL (ref 78.0–100.0)
PLATELETS: 259 10*3/uL (ref 150–400)
RBC: 3.4 MIL/uL — AB (ref 3.87–5.11)
RDW: 22.6 % — ABNORMAL HIGH (ref 11.5–15.5)
WBC: 5.2 10*3/uL (ref 4.0–10.5)

## 2015-03-16 LAB — BASIC METABOLIC PANEL
Anion gap: 9 (ref 5–15)
BUN: 26 mg/dL — ABNORMAL HIGH (ref 6–20)
CHLORIDE: 100 mmol/L — AB (ref 101–111)
CO2: 29 mmol/L (ref 22–32)
CREATININE: 1.28 mg/dL — AB (ref 0.44–1.00)
Calcium: 7.6 mg/dL — ABNORMAL LOW (ref 8.9–10.3)
GFR, EST AFRICAN AMERICAN: 47 mL/min — AB (ref >60)
GFR, EST NON AFRICAN AMERICAN: 40 mL/min — AB (ref >60)
Glucose, Bld: 85 mg/dL (ref 65–99)
POTASSIUM: 3.5 mmol/L (ref 3.5–5.1)
SODIUM: 138 mmol/L (ref 135–145)

## 2015-03-16 MED ORDER — CALCIUM CARBONATE ANTACID 500 MG PO CHEW
1.0000 | CHEWABLE_TABLET | Freq: Four times a day (QID) | ORAL | Status: DC | PRN
  Administered 2015-03-16 – 2015-03-18 (×3): 200 mg via ORAL
  Filled 2015-03-16 (×3): qty 1

## 2015-03-16 MED ORDER — LEVALBUTEROL HCL 0.63 MG/3ML IN NEBU
0.6300 mg | INHALATION_SOLUTION | Freq: Three times a day (TID) | RESPIRATORY_TRACT | Status: DC
  Administered 2015-03-16 – 2015-03-20 (×11): 0.63 mg via RESPIRATORY_TRACT
  Filled 2015-03-16 (×11): qty 3

## 2015-03-16 MED ORDER — WHITE PETROLATUM GEL
Status: AC
  Administered 2015-03-16: 0.2
  Filled 2015-03-16: qty 1

## 2015-03-16 MED ORDER — CETYLPYRIDINIUM CHLORIDE 0.05 % MT LIQD
7.0000 mL | Freq: Two times a day (BID) | OROMUCOSAL | Status: DC
  Administered 2015-03-16 – 2015-03-22 (×13): 7 mL via OROMUCOSAL

## 2015-03-16 NOTE — Plan of Care (Signed)
Problem: ICU Phase Progression Outcomes Goal: Dyspnea controlled at rest Outcome: Progressing With any movement pt becomes very SOB and desats to the upper 80's.  Pt recovers within five minutes and when at rest dyspnea resolves and oxygen is at 98% on 4L.

## 2015-03-16 NOTE — Plan of Care (Signed)
Problem: ICU Phase Progression Outcomes Goal: Voiding-avoid urinary catheter unless indicated Outcome: Completed/Met Date Met:  03/16/15 Pt is able to use the [facility] with minimal assistance managing the wires.

## 2015-03-16 NOTE — Progress Notes (Signed)
[facility]                                                                              Patient Demographics  Brittany Snow, is a 75 y.o. female, DOB - May 21, 1940, [id number]  Admit date - 03/15/2015   Admitting Physician Albertine Patricia, MD  Outpatient Primary MD for the patient is No primary care provider on file.  LOS - 1   Chief Complaint  Patient presents with  . Shortness of Breath      HPI on 03/15/2015 by Dr. Phillips Climes Brittany Snow is a 75 y.o. female, with past medical history of COPD, on 3 L nasal cannula, history of atrial fibrillation on Eliquis, OSA noncompliant with C Pap, diastolic CHF, chronic anemia, recent hospitalization at [facility] for respiratory failure secondary to COPD exacerbation, CHF exacerbation, and pneumonia, discharged to SNF, at facility patient was noticed to have hypoxia, as well she reports productive cough, green phlegm over the last few days, in ED patient requiring NRB, fever 101.3, no leukocytosis, his x-ray significant for multiple opacities significant for PE versus pneumonia, known baseline anemia with hemoglobin of 7.9(was 7.4 on discharge from [place] on 2/12), was an A. fib with heart rate in 110s, she reports worsening lower extremity edema over few days as well, received IV Lasix in ED, and I was called to admit.  Assessment & Plan   Sepsis secondary to HCAP -Upon admission patient was febrile with tachycardia, tachypnea -Chest x-ray showed bilateral opacities, edema versus infection -Influenza panel negative -Continue vancomycin and cefepime -Blood cultures pending -Strep pneumonia urine antigen negative  Acute on chronic respiratory failure -Likely multifactorial including COPD versus pneumonia versus heart failure -Patient uses approximately 3 L of oxygen at home -Patient did require nonrebreather upon admission -Improving  Acute diastolic heart failure exacerbation -Recent echocardiogram with [facility]  on 02/14/15: EF 55-60%, unable to determine diastolic function -Furosemide held -Continue Lasix -Monitor intake and output, daily weights  Anemia of chronic disease -Hemoglobin appears to be 8.6 (baseline 7-8) -Patient was transfused 1 unit PRBC -Continue monitor CBC  Atrial fibrillation -CHADSVASC 3 (based on Age, CHF, gender) -Continue Eliquis for anticoagulation -Continue Cardizem, metoprolol  Acute renal failure -Creatinine was 1.58 upon admission, currently 1.28 -Baseline approximately 1.1  COPD -Currently not wheezing -Continue home medications, nebulizer treatments, steroids, Symbicort, Spiriva  Stage II pressure ulcer -Continue wound care  Code Status: Full  Family Communication: None at bedside.  Disposition Plan: Admitted. Continue treatment.   Time Spent in minutes   30 minutes  Procedures  None  Consults   None  DVT Prophylaxis  Eliquis  Lab Results  Component Value Date   PLT 259 03/16/2015    Medications  Scheduled Meds: . antiseptic oral rinse  7 mL Mouth Rinse BID  . apixaban  5 mg Oral BID  . atorvastatin  10 mg Oral q1800  . budesonide-formoterol  2 puff Inhalation BID  . ceFEPime (MAXIPIME) IV  2 g Intravenous Q24H  . cholecalciferol  5,000 Units Oral Daily  . diltiazem  180 mg Oral Daily  . famotidine  20 mg Oral Daily  . furosemide  60 mg Intravenous BID  .  guaiFENesin  600 mg Oral BID  . iron polysaccharides  150 mg Oral BID  . levalbuterol  0.63 mg Nebulization TID  . levothyroxine  75 mcg Oral QAC breakfast  . loratadine  10 mg Oral Daily  . Melatonin  4.5 mg Oral QHS  . metoprolol succinate  150 mg Oral Daily  . polyethylene glycol  17 g Oral Daily  . sodium chloride flush  3 mL Intravenous Q12H  . tiotropium  18 mcg Inhalation Daily  . vancomycin  750 mg Intravenous Q24H   Continuous Infusions:  PRN Meds:.acetaminophen, albuterol, docusate sodium, triamcinolone  Antibiotics    Anti-infectives    Start     Dose/Rate  Route Frequency Ordered Stop   03/16/15 1200  vancomycin (VANCOCIN) IVPB 750 mg/150 ml premix     750 mg 150 mL/hr over 60 Minutes Intravenous Every 24 hours 03/15/15 1032 03/23/15 1159   03/16/15 1030  ceFEPIme (MAXIPIME) 2 g in dextrose 5 % 50 mL IVPB  Status:  Discontinued     2 g 100 mL/hr over 30 Minutes Intravenous Every 24 hours 03/15/15 1032 03/15/15 1033   03/15/15 1100  ceFEPIme (MAXIPIME) 2 g in dextrose 5 % 50 mL IVPB     2 g 100 mL/hr over 30 Minutes Intravenous Every 24 hours 03/15/15 1033 03/23/15 1059   03/15/15 1030  ceFEPIme (MAXIPIME) 2 g in dextrose 5 % 50 mL IVPB  Status:  Discontinued     2 g 100 mL/hr over 30 Minutes Intravenous Every 24 hours 03/15/15 1020 03/15/15 1032   03/15/15 1015  vancomycin (VANCOCIN) 1,500 mg in sodium chloride 0.9 % 500 mL IVPB     1,500 mg 250 mL/hr over 120 Minutes Intravenous  Once 03/15/15 1012 03/15/15 1503   03/15/15 1000  oseltamivir (TAMIFLU) capsule 30 mg  Status:  Discontinued     30 mg Oral 2 times daily 03/15/15 0930 03/16/15 0936   03/15/15 0915  ceFEPIme (MAXIPIME) 1 g in dextrose 5 % 50 mL IVPB  Status:  Discontinued     1 g 100 mL/hr over 30 Minutes Intravenous 3 times per day 03/15/15 0914 03/15/15 1020      Subjective:   Brittany Snow seen and examined today.  Patient states her breathing has improved some. She says she was recently admitted to another hospital for double pneumonia. Currently denies any chest pain, abdominal pain, nausea or vomiting.  Objective:   Filed Vitals:   03/16/15 0800 03/16/15 0941 03/16/15 1002 03/16/15 1249  BP: 135/67  131/87 96/51  Pulse: 149  140 123  Temp: 98 F (36.7 C)   97.9 F (36.6 C)  TempSrc: Oral   Oral  Resp: 19   22  Height:      Weight:      SpO2: 98% 92%  95%    Wt Readings from Last 3 Encounters:  03/15/15 82.4 kg (181 lb 10.5 oz)     Intake/Output Summary (Last 24 hours) at 03/16/15 1402 Last data filed at 03/16/15 1009  Gross per 24 hour  Intake    243 ml    Output    900 ml  Net   -657 ml    Exam  General: Well developed, well nourished, NAD, appears stated age  64: NCAT, mucous membranes moist.   Cardiovascular: S1 S2 auscultated, tachycardic, irregular  Respiratory: Diminished, no wheezing.  Abdomen: Soft, nontender, nondistended, + bowel sounds  Extremities: warm dry without cyanosis clubbing or edema  Neuro: AAOx3, nonfocal  Psych: Normal affect and demeanor  Data Review   Micro Results Recent Results (from the past 240 hour(s))  MRSA PCR Screening     Status: None   Collection Time: 03/15/15  9:43 PM  Result Value Ref Range Status   MRSA by PCR NEGATIVE NEGATIVE Final    Comment:        The GeneXpert MRSA Assay (FDA approved for NASAL specimens only), is one component of a comprehensive MRSA colonization surveillance program. It is not intended to diagnose MRSA infection nor to guide or monitor treatment for MRSA infections.     Radiology Reports Dg Chest Port 1 View  03/15/2015  CLINICAL DATA:  Acute onset of worsening shortness of breath and severe generalized weakness. Initial encounter. EXAM: PORTABLE CHEST 1 VIEW COMPARISON:  Chest radiograph performed 12/06/2013 FINDINGS: The lungs are relatively well expanded. Patchy bibasilar airspace opacities may reflect pulmonary edema or pneumonia. No definite pleural effusion or pneumothorax is seen. Vascular congestion is noted. The cardiomediastinal silhouette is mildly enlarged. No acute osseous abnormalities are identified. IMPRESSION: Vascular congestion and mild cardiomegaly. Patchy bibasilar airspace opacities may reflect pulmonary edema or pneumonia. Electronically Signed   By: Garald Balding M.D.   On: 03/15/2015 07:02    CBC  Recent Labs Lab 03/15/15 0647 03/16/15 0506  WBC 5.9 5.2  HGB 7.9* 8.6*  HCT 25.7* 27.6*  PLT 278 259  MCV 79.1 81.2  MCH 24.3* 25.3*  MCHC 30.7 31.2  RDW 24.1* 22.6*  LYMPHSABS 0.4*  --   MONOABS 0.3  --   EOSABS 0.1   --   BASOSABS 0.0  --     Chemistries   Recent Labs Lab 03/15/15 0647 03/16/15 0506  NA 135 138  K 4.4 3.5  CL 97* 100*  CO2 27 29  GLUCOSE 110* 85  BUN 31* 26*  CREATININE 1.58* 1.28*  CALCIUM 8.1* 7.6*  AST 19  --   ALT 17  --   ALKPHOS 48  --   BILITOT 0.8  --    ------------------------------------------------------------------------------------------------------------------ estimated creatinine clearance is 36.7 mL/min (by C-G formula based on Cr of 1.28). ------------------------------------------------------------------------------------------------------------------ No results for input(s): HGBA1C in the last 72 hours. ------------------------------------------------------------------------------------------------------------------ No results for input(s): CHOL, HDL, LDLCALC, TRIG, CHOLHDL, LDLDIRECT in the last 72 hours. ------------------------------------------------------------------------------------------------------------------ No results for input(s): TSH, T4TOTAL, T3FREE, THYROIDAB in the last 72 hours.  Invalid input(s): FREET3 ------------------------------------------------------------------------------------------------------------------ No results for input(s): VITAMINB12, FOLATE, FERRITIN, TIBC, IRON, RETICCTPCT in the last 72 hours.  Coagulation profile No results for input(s): INR, PROTIME in the last 168 hours.  No results for input(s): DDIMER in the last 72 hours.  Cardiac Enzymes No results for input(s): CKMB, TROPONINI, MYOGLOBIN in the last 168 hours.  Invalid input(s): CK ------------------------------------------------------------------------------------------------------------------ Invalid input(s): POCBNP    Nellie Pester D.O. on 03/16/2015 at 2:02 PM  Between 7am to 7pm - Pager - [phone]  After 7pm go to [email or web address] - password [email or web address]  And look for the night coverage person covering for me after hours  [facility] Office  [phone]

## 2015-03-16 NOTE — Discharge Instructions (Signed)

## 2015-03-16 NOTE — Progress Notes (Signed)
Utilization Review Completed.  

## 2015-03-17 DIAGNOSIS — A419 Sepsis, unspecified organism: Principal | ICD-10-CM

## 2015-03-17 LAB — CBC
HEMATOCRIT: 27.4 % — AB (ref 36.0–46.0)
Hemoglobin: 8.6 g/dL — ABNORMAL LOW (ref 12.0–15.0)
MCH: 25.5 pg — AB (ref 26.0–34.0)
MCHC: 31.4 g/dL (ref 30.0–36.0)
MCV: 81.3 fL (ref 78.0–100.0)
Platelets: 312 10*3/uL (ref 150–400)
RBC: 3.37 MIL/uL — ABNORMAL LOW (ref 3.87–5.11)
RDW: 22.6 % — AB (ref 11.5–15.5)
WBC: 5.8 10*3/uL (ref 4.0–10.5)

## 2015-03-17 LAB — BASIC METABOLIC PANEL
Anion gap: 8 (ref 5–15)
BUN: 23 mg/dL — AB (ref 6–20)
CHLORIDE: 98 mmol/L — AB (ref 101–111)
CO2: 33 mmol/L — AB (ref 22–32)
CREATININE: 1.26 mg/dL — AB (ref 0.44–1.00)
Calcium: 7.9 mg/dL — ABNORMAL LOW (ref 8.9–10.3)
GFR calc Af Amer: 47 mL/min — ABNORMAL LOW (ref >60)
GFR calc non Af Amer: 41 mL/min — ABNORMAL LOW (ref >60)
GLUCOSE: 84 mg/dL (ref 65–99)
Potassium: 3.2 mmol/L — ABNORMAL LOW (ref 3.5–5.1)
SODIUM: 139 mmol/L (ref 135–145)

## 2015-03-17 MED ORDER — VITAMIN D 1000 UNITS PO TABS
5000.0000 [IU] | ORAL_TABLET | Freq: Every day | ORAL | Status: DC
  Administered 2015-03-17 – 2015-03-22 (×6): 5000 [IU] via ORAL
  Filled 2015-03-17 (×6): qty 5

## 2015-03-17 MED ORDER — POTASSIUM CHLORIDE CRYS ER 20 MEQ PO TBCR
40.0000 meq | EXTENDED_RELEASE_TABLET | Freq: Once | ORAL | Status: AC
  Administered 2015-03-17: 40 meq via ORAL
  Filled 2015-03-17: qty 2

## 2015-03-17 MED ORDER — DILTIAZEM HCL ER COATED BEADS 120 MG PO CP24
120.0000 mg | ORAL_CAPSULE | Freq: Once | ORAL | Status: AC
  Administered 2015-03-17: 120 mg via ORAL
  Filled 2015-03-17: qty 1

## 2015-03-17 MED ORDER — DILTIAZEM HCL ER 240 MG PO CP24
240.0000 mg | ORAL_CAPSULE | Freq: Every day | ORAL | Status: DC
  Administered 2015-03-18 – 2015-03-19 (×2): 240 mg via ORAL
  Filled 2015-03-17: qty 2
  Filled 2015-03-17 (×4): qty 1

## 2015-03-17 NOTE — Progress Notes (Signed)
[facility]                                                                              Patient Demographics  Brittany Snow, is a 75 y.o. female, DOB - [birth year], [id number]  Admit date - 03/15/2015   Admitting Physician Albertine Patricia, MD  Outpatient Primary MD for the patient is No primary care provider on file.  LOS - 2   Chief Complaint  Patient presents with  . Shortness of Breath      HPI on 03/15/2015 by Dr. Phillips Climes Brittany Snow is a 75 y.o. female, with past medical history of COPD, on 3 L nasal cannula, history of atrial fibrillation on Eliquis, OSA noncompliant with C Pap, diastolic CHF, chronic anemia, recent hospitalization at [facility] for respiratory failure secondary to COPD exacerbation, CHF exacerbation, and pneumonia, discharged to SNF, at facility patient was noticed to have hypoxia, as well she reports productive cough, green phlegm over the last few days, in ED patient requiring NRB, fever 101.3, no leukocytosis, his x-ray significant for multiple opacities significant for PE versus pneumonia, known baseline anemia with hemoglobin of 7.9(was 7.4 on discharge from [facility] on 2/12), was an A. fib with heart rate in 110s, she reports worsening lower extremity edema over few days as well, received IV Lasix in ED, and I was called to admit.  Assessment & Plan   Sepsis secondary to HCAP -Upon admission patient was febrile with tachycardia, tachypnea -Chest x-ray showed bilateral opacities, edema versus infection -Influenza panel negative -Continue vancomycin and cefepime -Blood cultures show no growth to date -Strep pneumonia urine antigen negative  Acute on chronic respiratory failure -Likely multifactorial including COPD versus pneumonia versus heart failure -Patient uses approximately 3 L of oxygen at home -Patient did require nonrebreather upon admission -Improving -Will consult PT  Acute diastolic heart failure exacerbation -Recent  echocardiogram with [facility] on 02/14/15: EF 55-60%, unable to determine diastolic function -Furosemide held -Continue Lasix -Monitor intake and output, daily weights  Anemia of chronic disease -Hemoglobin appears to be 8.6 (baseline 7-8) -Patient was transfused 1 unit PRBC -Continue monitor CBC  Atrial fibrillation -CHADSVASC 3 (based on Age, CHF, gender) -Continue Eliquis for anticoagulation -Continue Cardizem, metoprolol -Will increase the dose of cardizem if BP allows  Acute renal failure -Creatinine was 1.58 upon admission, currently 1.26 -Baseline approximately 1.1  COPD -Currently not wheezing -Continue home medications, nebulizer treatments, steroids, Symbicort, Spiriva  Stage II pressure ulcer -Continue wound care  Code Status: Full  Family Communication: None at bedside.  Disposition Plan: Admitted. Continue treatment. Possible discharge within the next 24-48hrs  Time Spent in minutes   30 minutes  Procedures  None  Consults   None  DVT Prophylaxis  Eliquis  Lab Results  Component Value Date   PLT 312 03/17/2015    Medications  Scheduled Meds: . antiseptic oral rinse  7 mL Mouth Rinse BID  . apixaban  5 mg Oral BID  . atorvastatin  10 mg Oral q1800  . budesonide-formoterol  2 puff Inhalation BID  . ceFEPime (MAXIPIME) IV  2 g Intravenous Q24H  . cholecalciferol  5,000 Units Oral Daily  . diltiazem  180 mg Oral Daily  . famotidine  20 mg Oral Daily  . furosemide  60 mg Intravenous BID  . guaiFENesin  600 mg Oral BID  . iron polysaccharides  150 mg Oral BID  . levalbuterol  0.63 mg Nebulization TID  . levothyroxine  75 mcg Oral QAC breakfast  . loratadine  10 mg Oral Daily  . Melatonin  4.5 mg Oral QHS  . metoprolol succinate  150 mg Oral Daily  . polyethylene glycol  17 g Oral Daily  . sodium chloride flush  3 mL Intravenous Q12H  . tiotropium  18 mcg Inhalation Daily  . vancomycin  750 mg Intravenous Q24H   Continuous Infusions:    PRN Meds:.acetaminophen, albuterol, calcium carbonate, docusate sodium, triamcinolone  Antibiotics    Anti-infectives    Start     Dose/Rate Route Frequency Ordered Stop   03/16/15 1200  vancomycin (VANCOCIN) IVPB 750 mg/150 ml premix     750 mg 150 mL/hr over 60 Minutes Intravenous Every 24 hours 03/15/15 1032 03/23/15 1159   03/16/15 1030  ceFEPIme (MAXIPIME) 2 g in dextrose 5 % 50 mL IVPB  Status:  Discontinued     2 g 100 mL/hr over 30 Minutes Intravenous Every 24 hours 03/15/15 1032 03/15/15 1033   03/15/15 1100  ceFEPIme (MAXIPIME) 2 g in dextrose 5 % 50 mL IVPB     2 g 100 mL/hr over 30 Minutes Intravenous Every 24 hours 03/15/15 1033 03/23/15 1059   03/15/15 1030  ceFEPIme (MAXIPIME) 2 g in dextrose 5 % 50 mL IVPB  Status:  Discontinued     2 g 100 mL/hr over 30 Minutes Intravenous Every 24 hours 03/15/15 1020 03/15/15 1032   03/15/15 1015  vancomycin (VANCOCIN) 1,500 mg in sodium chloride 0.9 % 500 mL IVPB     1,500 mg 250 mL/hr over 120 Minutes Intravenous  Once 03/15/15 1012 03/15/15 1503   03/15/15 1000  oseltamivir (TAMIFLU) capsule 30 mg  Status:  Discontinued     30 mg Oral 2 times daily 03/15/15 0930 03/16/15 0936   03/15/15 0915  ceFEPIme (MAXIPIME) 1 g in dextrose 5 % 50 mL IVPB  Status:  Discontinued     1 g 100 mL/hr over 30 Minutes Intravenous 3 times per day 03/15/15 0914 03/15/15 1020      Subjective:   Brittany Snow seen and examined today.  Patient states her breathing has improved some. She says she was recently admitted to another hospital for double pneumonia. Currently denies any chest pain, abdominal pain, nausea or vomiting.  Objective:   Filed Vitals:   03/17/15 0516 03/17/15 0600 03/17/15 0758 03/17/15 0800  BP:  93/64  102/57  Pulse:  27  124  Temp:    98.1 F (36.7 C)  TempSrc:    Oral  Resp:  18  16  Height:      Weight: 78.4 kg (172 lb 13.5 oz)     SpO2:  100% 98% 99%    Wt Readings from Last 3 Encounters:  03/17/15 78.4 kg (172 lb  13.5 oz)     Intake/Output Summary (Last 24 hours) at 03/17/15 1121 Last data filed at 03/17/15 0730  Gross per 24 hour  Intake    630 ml  Output   1800 ml  Net  -1170 ml    Exam  General: Well developed, well nourished, NA  HEENT: NCAT, mucous membranes moist.   Cardiovascular: S1 S2 auscultated, irregularly irregular  Respiratory: Diminished but clear,  no wheezing.  Abdomen: Soft, nontender, nondistended, + bowel sounds  Extremities: warm dry without cyanosis clubbing. LE edema improving.  Neuro: AAOx3, nonfocal  Psych: Normal affect and demeanor, pleasant  Data Review   Micro Results Recent Results (from the past 240 hour(s))  Culture, blood (routine x 2) Call MD if unable to obtain prior to antibiotics being given     Status: None (Preliminary result)   Collection Time: 03/15/15  9:25 AM  Result Value Ref Range Status   Specimen Description BLOOD RIGHT FOREARM  Final   Special Requests BOTTLES DRAWN AEROBIC AND ANAEROBIC 5CCS  Final   Culture NO GROWTH 1 DAY  Final   Report Status PENDING  Incomplete  Culture, blood (routine x 2) Call MD if unable to obtain prior to antibiotics being given     Status: None (Preliminary result)   Collection Time: 03/15/15  9:31 AM  Result Value Ref Range Status   Specimen Description BLOOD RIGHT HAND  Final   Special Requests BOTTLES DRAWN AEROBIC ONLY 10CCS  Final   Culture NO GROWTH 1 DAY  Final   Report Status PENDING  Incomplete  MRSA PCR Screening     Status: None   Collection Time: 03/15/15  9:43 PM  Result Value Ref Range Status   MRSA by PCR NEGATIVE NEGATIVE Final    Comment:        The GeneXpert MRSA Assay (FDA approved for NASAL specimens only), is one component of a comprehensive MRSA colonization surveillance program. It is not intended to diagnose MRSA infection nor to guide or monitor treatment for MRSA infections.     Radiology Reports Dg Chest Port 1 View  03/15/2015  CLINICAL DATA:  Acute onset  of worsening shortness of breath and severe generalized weakness. Initial encounter. EXAM: PORTABLE CHEST 1 VIEW COMPARISON:  Chest radiograph performed 12/06/2013 FINDINGS: The lungs are relatively well expanded. Patchy bibasilar airspace opacities may reflect pulmonary edema or pneumonia. No definite pleural effusion or pneumothorax is seen. Vascular congestion is noted. The cardiomediastinal silhouette is mildly enlarged. No acute osseous abnormalities are identified. IMPRESSION: Vascular congestion and mild cardiomegaly. Patchy bibasilar airspace opacities may reflect pulmonary edema or pneumonia. Electronically Signed   By: Garald Balding M.D.   On: 03/15/2015 07:02    CBC  Recent Labs Lab 03/15/15 0647 03/16/15 0506 03/17/15 0407  WBC 5.9 5.2 5.8  HGB 7.9* 8.6* 8.6*  HCT 25.7* 27.6* 27.4*  PLT 278 259 312  MCV 79.1 81.2 81.3  MCH 24.3* 25.3* 25.5*  MCHC 30.7 31.2 31.4  RDW 24.1* 22.6* 22.6*  LYMPHSABS 0.4*  --   --   MONOABS 0.3  --   --   EOSABS 0.1  --   --   BASOSABS 0.0  --   --     Chemistries   Recent Labs Lab 03/15/15 0647 03/16/15 0506 03/17/15 0407  NA 135 138 139  K 4.4 3.5 3.2*  CL 97* 100* 98*  CO2 27 29 33*  GLUCOSE 110* 85 84  BUN 31* 26* 23*  CREATININE 1.58* 1.28* 1.26*  CALCIUM 8.1* 7.6* 7.9*  AST 19  --   --   ALT 17  --   --   ALKPHOS 48  --   --   BILITOT 0.8  --   --    ------------------------------------------------------------------------------------------------------------------ estimated creatinine clearance is 36.3 mL/min (by C-G formula based on Cr of 1.26). ------------------------------------------------------------------------------------------------------------------ No results for input(s): HGBA1C in the last 72 hours. ------------------------------------------------------------------------------------------------------------------ No  results for input(s): CHOL, HDL, LDLCALC, TRIG, CHOLHDL, LDLDIRECT in the last 72  hours. ------------------------------------------------------------------------------------------------------------------ No results for input(s): TSH, T4TOTAL, T3FREE, THYROIDAB in the last 72 hours.  Invalid input(s): FREET3 ------------------------------------------------------------------------------------------------------------------ No results for input(s): VITAMINB12, FOLATE, FERRITIN, TIBC, IRON, RETICCTPCT in the last 72 hours.  Coagulation profile No results for input(s): INR, PROTIME in the last 168 hours.  No results for input(s): DDIMER in the last 72 hours.  Cardiac Enzymes No results for input(s): CKMB, TROPONINI, MYOGLOBIN in the last 168 hours.  Invalid input(s): CK ------------------------------------------------------------------------------------------------------------------ Invalid input(s): POCBNP    Trapper Meech D.O. on 03/17/2015 at 11:21 AM  Between 7am to 7pm - Pager - [phone]  After 7pm go to [email or web address] - password [email or web address]  And look for the night coverage person covering for me after hours  [facility] Office  [phone]

## 2015-03-18 LAB — BASIC METABOLIC PANEL
ANION GAP: 11 (ref 5–15)
BUN: 26 mg/dL — ABNORMAL HIGH (ref 6–20)
CALCIUM: 8.4 mg/dL — AB (ref 8.9–10.3)
CO2: 35 mmol/L — AB (ref 22–32)
Chloride: 94 mmol/L — ABNORMAL LOW (ref 101–111)
Creatinine, Ser: 1.31 mg/dL — ABNORMAL HIGH (ref 0.44–1.00)
GFR calc non Af Amer: 39 mL/min — ABNORMAL LOW (ref >60)
GFR, EST AFRICAN AMERICAN: 45 mL/min — AB (ref >60)
GLUCOSE: 93 mg/dL (ref 65–99)
POTASSIUM: 4.2 mmol/L (ref 3.5–5.1)
Sodium: 140 mmol/L (ref 135–145)

## 2015-03-18 LAB — CBC
HEMATOCRIT: 27.3 % — AB (ref 36.0–46.0)
HEMOGLOBIN: 8.1 g/dL — AB (ref 12.0–15.0)
MCH: 24.2 pg — AB (ref 26.0–34.0)
MCHC: 29.7 g/dL — AB (ref 30.0–36.0)
MCV: 81.5 fL (ref 78.0–100.0)
Platelets: 345 10*3/uL (ref 150–400)
RBC: 3.35 MIL/uL — AB (ref 3.87–5.11)
RDW: 23 % — ABNORMAL HIGH (ref 11.5–15.5)
WBC: 6.3 10*3/uL (ref 4.0–10.5)

## 2015-03-18 NOTE — Progress Notes (Addendum)
[facility]                                                                              Patient Demographics  Brittany Snow, is a 75 y.o. female, DOB - 1940/07/16, PO:[id number]  Admit date - 03/15/2015   Admitting Physician Albertine Patricia, MD  Outpatient Primary MD for the patient is No primary care provider on file.  LOS - 3   Chief Complaint  Patient presents with  . Shortness of Breath      HPI on 03/15/2015 by Dr. Phillips Climes Brittany Snow is a 75 y.o. female, with past medical history of COPD, on 3 L nasal cannula, history of atrial fibrillation on Eliquis, OSA noncompliant with C Pap, diastolic CHF, chronic anemia, recent hospitalization at [facility] for respiratory failure secondary to COPD exacerbation, CHF exacerbation, and pneumonia, discharged to SNF, at facility patient was noticed to have hypoxia, as well she reports productive cough, green phlegm over the last few days, in ED patient requiring NRB, fever 101.3, no leukocytosis, his x-ray significant for multiple opacities significant for PE versus pneumonia, known baseline anemia with hemoglobin of 7.9(was 7.4 on discharge from [facility] on 2/12), was an A. fib with heart rate in 110s, she reports worsening lower extremity edema over few days as well, received IV Lasix in ED, and I was called to admit.  Assessment & Plan   Sepsis secondary to HCAP -Upon admission patient was febrile with tachycardia, tachypnea -Chest x-ray showed bilateral opacities, edema versus infection -Influenza panel negative -Continue vancomycin and cefepime -Blood cultures show no growth to date -Strep pneumonia urine antigen negative  Acute on chronic respiratory failure -Likely multifactorial including COPD versus pneumonia versus heart failure -Patient uses approximately 3 L of oxygen at home -Patient did require nonrebreather upon admission -Improving, curently on 2L O2 -PT consulted  Acute diastolic heart failure  exacerbation -Recent echocardiogram with [place] on 02/14/15: EF 55-60%, unable to determine diastolic function -Furosemide held -Continue Lasix -Monitor intake and output, daily weights -UOP over past 24 hours: 3L  Anemia of chronic disease -Hemoglobin appears to be 8.1 (baseline 7-8) -Patient was transfused 1 unit PRBC -Continue monitor CBC  Atrial fibrillation -CHADSVASC 3 (based on Age, CHF, gender) -Continue Eliquis for anticoagulation -Continue Cardizem, metoprolol -increased cardizem to 240mg  daily   Acute renal failure -Creatinine was 1.58 upon admission, currently 1.31 -Baseline approximately 1.1  COPD -Currently not wheezing -Continue home medications, nebulizer treatments, steroids, Symbicort, Spiriva  Stage II pressure ulcer -Continue wound care  Hypokalemia -Resolved  Code Status: Full  Family Communication: None at bedside.  Disposition Plan: Admitted. Continue treatment. Pending PT. Transfer to tele.  Time Spent in minutes   30 minutes  Procedures  None  Consults   None  DVT Prophylaxis  Eliquis  Lab Results  Component Value Date   PLT 345 03/18/2015    Medications  Scheduled Meds: . antiseptic oral rinse  7 mL Mouth Rinse BID  . apixaban  5 mg Oral BID  . atorvastatin  10 mg Oral q1800  . budesonide-formoterol  2 puff Inhalation BID  . ceFEPime (MAXIPIME) IV  2 g Intravenous Q24H  . cholecalciferol  5,000 Units Oral Daily  . diltiazem  240 mg Oral Daily  . famotidine  20 mg Oral Daily  . furosemide  60 mg Intravenous BID  . guaiFENesin  600 mg Oral BID  . iron polysaccharides  150 mg Oral BID  . levalbuterol  0.63 mg Nebulization TID  . levothyroxine  75 mcg Oral QAC breakfast  . loratadine  10 mg Oral Daily  . Melatonin  4.5 mg Oral QHS  . metoprolol succinate  150 mg Oral Daily  . polyethylene glycol  17 g Oral Daily  . sodium chloride flush  3 mL Intravenous Q12H  . tiotropium  18 mcg Inhalation Daily  . vancomycin   750 mg Intravenous Q24H   Continuous Infusions:  PRN Meds:.acetaminophen, albuterol, calcium carbonate, docusate sodium, triamcinolone  Antibiotics    Anti-infectives    Start     Dose/Rate Route Frequency Ordered Stop   03/16/15 1200  vancomycin (VANCOCIN) IVPB 750 mg/150 ml premix     750 mg 150 mL/hr over 60 Minutes Intravenous Every 24 hours 03/15/15 1032 03/23/15 1159   03/16/15 1030  ceFEPIme (MAXIPIME) 2 g in dextrose 5 % 50 mL IVPB  Status:  Discontinued     2 g 100 mL/hr over 30 Minutes Intravenous Every 24 hours 03/15/15 1032 03/15/15 1033   03/15/15 1100  ceFEPIme (MAXIPIME) 2 g in dextrose 5 % 50 mL IVPB     2 g 100 mL/hr over 30 Minutes Intravenous Every 24 hours 03/15/15 1033 03/23/15 1059   03/15/15 1030  ceFEPIme (MAXIPIME) 2 g in dextrose 5 % 50 mL IVPB  Status:  Discontinued     2 g 100 mL/hr over 30 Minutes Intravenous Every 24 hours 03/15/15 1020 03/15/15 1032   03/15/15 1015  vancomycin (VANCOCIN) 1,500 mg in sodium chloride 0.9 % 500 mL IVPB     1,500 mg 250 mL/hr over 120 Minutes Intravenous  Once 03/15/15 1012 03/15/15 1503   03/15/15 1000  oseltamivir (TAMIFLU) capsule 30 mg  Status:  Discontinued     30 mg Oral 2 times daily 03/15/15 0930 03/16/15 0936   03/15/15 0915  ceFEPIme (MAXIPIME) 1 g in dextrose 5 % 50 mL IVPB  Status:  Discontinued     1 g 100 mL/hr over 30 Minutes Intravenous 3 times per day 03/15/15 0914 03/15/15 1020      Subjective:   [name] seen and examined today.  Patient feels better today.  Still has come coughing.  Denies chest pain, abdominal pain, nausea or vomiting.  Breathing has been ok.  Feels her leg swelling is improving.  Objective:   Filed Vitals:   03/17/15 2130 03/18/15 0024 03/18/15 0441 03/18/15 0600  BP:  100/55 125/69 122/103  Pulse: 108 122  93  Temp: 98.3 F (36.8 C) 97.6 F (36.4 C) 97.6 F (36.4 C)   TempSrc: Oral Axillary Axillary   Resp: 25 23 21 19   Height:      Weight:   77.565 kg (171 lb)     SpO2: 99% 100%  97%    Wt Readings from Last 3 Encounters:  03/18/15 77.565 kg (171 lb)     Intake/Output Summary (Last 24 hours) at 03/18/15 0726 Last data filed at 03/18/15 0110  Gross per 24 hour  Intake    200 ml  Output   3100 ml  Net  -2900 ml    Exam  General: Well developed, well nourished, NA  HEENT: NCAT, mucous membranes moist.  Cardiovascular: S1 S2 auscultated, irregularly irregular  Respiratory: Diminished but clear, no wheezing.  Abdomen: Soft, nontender, nondistended, + bowel sounds  Extremities: warm dry without cyanosis clubbing. LE edema improving.  Neuro: AAOx3, nonfocal  Psych: Normal affect and demeanor, pleasant  Data Review   Micro Results Recent Results (from the past 240 hour(s))  Culture, blood (routine x 2) Call MD if unable to obtain prior to antibiotics being given     Status: None (Preliminary result)   Collection Time: 03/15/15  9:25 AM  Result Value Ref Range Status   Specimen Description BLOOD RIGHT FOREARM  Final   Special Requests BOTTLES DRAWN AEROBIC AND ANAEROBIC 5CCS  Final   Culture NO GROWTH 2 DAYS  Final   Report Status PENDING  Incomplete  Culture, blood (routine x 2) Call MD if unable to obtain prior to antibiotics being given     Status: None (Preliminary result)   Collection Time: 03/15/15  9:31 AM  Result Value Ref Range Status   Specimen Description BLOOD RIGHT HAND  Final   Special Requests BOTTLES DRAWN AEROBIC ONLY 10CCS  Final   Culture NO GROWTH 2 DAYS  Final   Report Status PENDING  Incomplete  MRSA PCR Screening     Status: None   Collection Time: 03/15/15  9:43 PM  Result Value Ref Range Status   MRSA by PCR NEGATIVE NEGATIVE Final    Comment:        The GeneXpert MRSA Assay (FDA approved for NASAL specimens only), is one component of a comprehensive MRSA colonization surveillance program. It is not intended to diagnose MRSA infection nor to guide or monitor treatment for MRSA infections.      Radiology Reports Dg Chest Port 1 View  03/15/2015  CLINICAL DATA:  Acute onset of worsening shortness of breath and severe generalized weakness. Initial encounter. EXAM: PORTABLE CHEST 1 VIEW COMPARISON:  Chest radiograph performed 12/06/2013 FINDINGS: The lungs are relatively well expanded. Patchy bibasilar airspace opacities may reflect pulmonary edema or pneumonia. No definite pleural effusion or pneumothorax is seen. Vascular congestion is noted. The cardiomediastinal silhouette is mildly enlarged. No acute osseous abnormalities are identified. IMPRESSION: Vascular congestion and mild cardiomegaly. Patchy bibasilar airspace opacities may reflect pulmonary edema or pneumonia. Electronically Signed   By: Garald Balding M.D.   On: 03/15/2015 07:02    CBC  Recent Labs Lab 03/15/15 0647 03/16/15 0506 03/17/15 0407 03/18/15 0404  WBC 5.9 5.2 5.8 6.3  HGB 7.9* 8.6* 8.6* 8.1*  HCT 25.7* 27.6* 27.4* 27.3*  PLT 278 259 312 345  MCV 79.1 81.2 81.3 81.5  MCH 24.3* 25.3* 25.5* 24.2*  MCHC 30.7 31.2 31.4 29.7*  RDW 24.1* 22.6* 22.6* 23.0*  LYMPHSABS 0.4*  --   --   --   MONOABS 0.3  --   --   --   EOSABS 0.1  --   --   --   BASOSABS 0.0  --   --   --     Chemistries   Recent Labs Lab 03/15/15 0647 03/16/15 0506 03/17/15 0407 03/18/15 0404  NA 135 138 139 140  K 4.4 3.5 3.2* 4.2  CL 97* 100* 98* 94*  CO2 27 29 33* 35*  GLUCOSE 110* 85 84 93  BUN 31* 26* 23* 26*  CREATININE 1.58* 1.28* 1.26* 1.31*  CALCIUM 8.1* 7.6* 7.9* 8.4*  AST 19  --   --   --   ALT 17  --   --   --  ALKPHOS 48  --   --   --   BILITOT 0.8  --   --   --    ------------------------------------------------------------------------------------------------------------------ estimated creatinine clearance is 34.7 mL/min (by C-G formula based on Cr of 1.31). ------------------------------------------------------------------------------------------------------------------ No results for input(s): HGBA1C in  the last 72 hours. ------------------------------------------------------------------------------------------------------------------ No results for input(s): CHOL, HDL, LDLCALC, TRIG, CHOLHDL, LDLDIRECT in the last 72 hours. ------------------------------------------------------------------------------------------------------------------ No results for input(s): TSH, T4TOTAL, T3FREE, THYROIDAB in the last 72 hours.  Invalid input(s): FREET3 ------------------------------------------------------------------------------------------------------------------ No results for input(s): VITAMINB12, FOLATE, FERRITIN, TIBC, IRON, RETICCTPCT in the last 72 hours.  Coagulation profile No results for input(s): INR, PROTIME in the last 168 hours.  No results for input(s): DDIMER in the last 72 hours.  Cardiac Enzymes No results for input(s): CKMB, TROPONINI, MYOGLOBIN in the last 168 hours.  Invalid input(s): CK ------------------------------------------------------------------------------------------------------------------ Invalid input(s): POCBNP    Jahziel Sinn D.O. on 03/18/2015 at 7:26 AM  Between 7am to 7pm - Pager - [phone]  After 7pm go to [email or web address] - password [email or web address]  And look for the night coverage person covering for me after hours  [facility] Office  [phone]

## 2015-03-18 NOTE — Progress Notes (Signed)
Pt out of bed sitting in chair c/o "feeling tired and not sharp today" O2 sats 89% on 2l. Oxygen dependent at home 2-3l. Flow increased to 3L per request.

## 2015-03-18 NOTE — Evaluation (Signed)
Physical Therapy Evaluation Patient Details Name: Brittany Snow MRN: [id number] DOB: Dec 22, 1940 Today's Date: 03/18/2015   History of Present Illness  Patient is a 75 yo female admitted 03/15/15 with hypoxia, productive cough, and fever.  Patient with HCAP, acute on chronic resp failure.  Patient with recent hospitalization with similar symptoms and d/c to SNF on 03/04/15 per chart.    PMH:  COPD on home O2 at 3 l/min, CHF, Afib, anemia  Clinical Impression  Patient presents with problems listed below.  Will benefit from acute PT to maximize functional mobility prior to discharge.  Patient with general weakness and deconditioning, decreased balance with gait, and decreased mobility.  Recommend patient return to SNF for continued therapy at discharge prior to returning home with husband.    Follow Up Recommendations SNF;Supervision for mobility/OOB    Equipment Recommendations  None recommended by PT    Recommendations for Other Services       Precautions / Restrictions Precautions Precautions: Fall Restrictions Weight Bearing Restrictions: No      Mobility  Bed Mobility Overal bed mobility: Needs Assistance Bed Mobility: Supine to Sit     Supine to sit: Min guard     General bed mobility comments: Verbal cues for technique.  Assist for safety.  Transfers Overall transfer level: Needs assistance Equipment used: Rolling walker (2 wheeled) Transfers: Sit to/from Stand Sit to Stand: Min assist;+2 safety/equipment         General transfer comment: Verbal cues for hand placement.  Assist to steady during transfers.  Ambulation/Gait Ambulation/Gait assistance: Min assist;+2 safety/equipment Ambulation Distance (Feet): 20 Feet Assistive device: Rolling walker (2 wheeled) Gait Pattern/deviations: Step-through pattern;Decreased step length - right;Decreased step length - left;Decreased stride length Gait velocity: decreased Gait velocity interpretation: Below normal speed for  age/gender General Gait Details: Verbal cues for safe use of RW.  Patient with slow, unsteady gait with RW.  Patient with O2 sats dropping to 85% with gait on O2 - cues not to talk during gait and for pursed lip breathing.  O2 sats back to 90% in sitting within 30 seconds.  HR increased into 120's with gait.  Back into 110's with seated rest.  Stairs            Wheelchair Mobility    Modified Rankin (Stroke Patients Only)       Balance Overall balance assessment: Needs assistance         Standing balance support: Bilateral upper extremity supported Standing balance-Leahy Scale: Poor                               Pertinent Vitals/Pain Pain Assessment: No/denies pain    Home Living Family/patient expects to be discharged to:: Skilled nursing facility Living Arrangements: Spouse/significant other Available Help at Discharge: Family;Available 24 hours/day Type of Home: House Home Access: Stairs to enter Entrance Stairs-Rails: Psychiatric nurse of Steps: 4 Home Layout: One level Home Equipment: Walker - 2 wheels;Bedside commode;Transport chair      Prior Function Level of Independence: Needs assistance   Gait / Transfers Assistance Needed: Was ambulating with RW during PT at SNF with assist.   Prior to initial hospitalization, patient was independent with ambulation  ADL's / Homemaking Assistance Needed: Assist with ADL at SNF.  Prior to initial hospitalization, patient was independent with bathing, dressing, ADL's.  Husband does cooking.        Hand Dominance  Extremity/Trunk Assessment   Upper Extremity Assessment: Generalized weakness           Lower Extremity Assessment: Generalized weakness         Communication   Communication: No difficulties  Cognition Arousal/Alertness: Awake/alert Behavior During Therapy: WFL for tasks assessed/performed Overall Cognitive Status: Within Functional Limits for tasks  assessed                      General Comments General comments (skin integrity, edema, etc.): Reviewed pursed lip breathing.  Patient tends to breath through mouth.  Also encouraged patient to sit quietly and breathe deeply through nose when O2 sats drop (dropped with gait and with talking in sitting on O2).    Exercises        Assessment/Plan    PT Assessment Patient needs continued PT services  PT Diagnosis Difficulty walking;Generalized weakness   PT Problem List Decreased strength;Decreased activity tolerance;Decreased balance;Decreased mobility;Decreased knowledge of use of DME;Decreased safety awareness;Cardiopulmonary status limiting activity  PT Treatment Interventions DME instruction;Gait training;Stair training;Functional mobility training;Therapeutic activities;Patient/family education   PT Goals (Current goals can be found in the Care Plan section) Acute Rehab PT Goals Patient Stated Goal: To return home PT Goal Formulation: With patient Time For Goal Achievement: 03/25/15 Potential to Achieve Goals: [place]    Frequency Min 3X/week   Barriers to discharge        Co-evaluation               End of Session Equipment Utilized During Treatment: Gait belt;Oxygen Activity Tolerance: Patient limited by fatigue;Treatment limited secondary to medical complications (Comment) (Decreased O2 sats) Patient left: in chair;with call bell/phone within reach Nurse Communication: Mobility status         Time: [id number] PT Time Calculation (min) (ACUTE ONLY): 24 min   Charges:   PT Evaluation $PT Eval High Complexity: 1 Procedure PT Treatments $Gait Training: 8-22 mins   PT G Codes:        Despina Pole 04/09/2015, 10:22 AM Carita Pian. Sanjuana Kava, [place] Pager [phone]

## 2015-03-18 NOTE — Progress Notes (Signed)
Pharmacy Antibiotic Note Brittany Snow is a 75 y.o. female admitted from SNF on 03/15/2015 with worsening SOB and concern for PNA vs CHF. Recent hospitalization at OSH for similar early this month. At that time SCr 1.1; today it is 1.3. No fevers noted, wbc is within normal limits and cultures are ngtd. Stop dates in place for abx.   Plan: 1. Cefepime 2 grams IV Q 24 hours starting this morning 2. Vancomycin 750 mg IV q 24 hours 3. Will plan on checking VT in the next 24-48 hours  4. F/u cx data, clinical response and other pertinent labs/test and narrow abx as feasible 5. Following along with you daily  Weight: 175 lb 4.3 oz (79.5 kg)  Temp (24hrs), Avg:100.4 F (38 C), Min:99.5 F (37.5 C), Max:101.3 F (38.5 C)   Recent Labs Lab 03/18/15  WBC 6.3  CREATININE 1.31     No Known Allergies  Antimicrobials this admission: 2/23 Cefepime >>  2/23 Vancomycin >>  Dose adjustments this admission: n/a  Microbiology results: Flu neg MRSA PCR neg Blood x 2 2/23>> ngtd  Thank you for allowing pharmacy to be a part of this patient's care.  Erin Hearing PharmD., BCPS Clinical Pharmacist Pager [phone] 03/18/2015 8:17 AM

## 2015-03-18 NOTE — Progress Notes (Signed)
Report called to [place], [place] 6 E receiving nurse. Transferred out via bed, spouse present at the time of transfer.

## 2015-03-19 ENCOUNTER — Inpatient Hospital Stay (HOSPITAL_COMMUNITY): Payer: Medicare Other

## 2015-03-19 LAB — CBC
HEMATOCRIT: 29.1 % — AB (ref 36.0–46.0)
Hemoglobin: 8.6 g/dL — ABNORMAL LOW (ref 12.0–15.0)
MCH: 24.1 pg — ABNORMAL LOW (ref 26.0–34.0)
MCHC: 29.6 g/dL — AB (ref 30.0–36.0)
MCV: 81.5 fL (ref 78.0–100.0)
Platelets: 429 10*3/uL — ABNORMAL HIGH (ref 150–400)
RBC: 3.57 MIL/uL — ABNORMAL LOW (ref 3.87–5.11)
RDW: 23.5 % — AB (ref 11.5–15.5)
WBC: 7.1 10*3/uL (ref 4.0–10.5)

## 2015-03-19 LAB — BASIC METABOLIC PANEL
ANION GAP: 13 (ref 5–15)
BUN: 24 mg/dL — ABNORMAL HIGH (ref 6–20)
CALCIUM: 8.9 mg/dL (ref 8.9–10.3)
CO2: 36 mmol/L — ABNORMAL HIGH (ref 22–32)
CREATININE: 1.43 mg/dL — AB (ref 0.44–1.00)
Chloride: 91 mmol/L — ABNORMAL LOW (ref 101–111)
GFR calc non Af Amer: 35 mL/min — ABNORMAL LOW (ref >60)
GFR, EST AFRICAN AMERICAN: 41 mL/min — AB (ref >60)
Glucose, Bld: 97 mg/dL (ref 65–99)
Potassium: 3.3 mmol/L — ABNORMAL LOW (ref 3.5–5.1)
SODIUM: 140 mmol/L (ref 135–145)

## 2015-03-19 MED ORDER — SODIUM CHLORIDE 0.9 % IV BOLUS (SEPSIS)
250.0000 mL | Freq: Once | INTRAVENOUS | Status: AC
  Administered 2015-03-19: 250 mL via INTRAVENOUS

## 2015-03-19 MED ORDER — GERHARDT'S BUTT CREAM
TOPICAL_CREAM | Freq: Two times a day (BID) | CUTANEOUS | Status: DC
  Administered 2015-03-19 – 2015-03-21 (×6): via TOPICAL
  Filled 2015-03-19: qty 1

## 2015-03-19 NOTE — Progress Notes (Addendum)
[facility]                                                                              Patient Demographics  Brittany Snow, is a 75 y.o. female, DOB - Feb 07, 1940, PO:[id number]  Admit date - 03/15/2015   Admitting Physician Albertine Patricia, MD  Outpatient Primary MD for the patient is No primary care provider on file.  LOS - 4   Chief Complaint  Patient presents with  . Shortness of Breath      HPI on 03/15/2015 by Dr. Phillips Climes Brittany Snow is a 76 y.o. female, with past medical history of COPD, on 3 L nasal cannula, history of atrial fibrillation on Eliquis, OSA noncompliant with C Pap, diastolic CHF, chronic anemia, recent hospitalization at [facility] for respiratory failure secondary to COPD exacerbation, CHF exacerbation, and pneumonia, discharged to SNF, at facility patient was noticed to have hypoxia, as well she reports productive cough, green phlegm over the last few days, in ED patient requiring NRB, fever 101.3, no leukocytosis, his x-ray significant for multiple opacities significant for PE versus pneumonia, known baseline anemia with hemoglobin of 7.9(was 7.4 on discharge from [place] on 2/12), was an A. fib with heart rate in 110s, she reports worsening lower extremity edema over few days as well, received IV Lasix in ED, and I was called to admit.  Assessment & Plan   Sepsis secondary to HCAP -Upon admission patient was febrile with tachycardia, tachypnea -Chest x-ray showed bilateral opacities, edema versus infection -Influenza panel negative -Continue vancomycin and cefepime -Blood cultures show no growth to date -Strep pneumonia urine antigen negative  Acute on chronic respiratory failure -Likely multifactorial including COPD versus pneumonia versus heart failure -Patient uses approximately 3 L of oxygen at home -Patient did require nonrebreather upon admission -Improving, curently on 2L O2 -PT consulted and recommended SNF  Acute diastolic  heart failure exacerbation -Recent echocardiogram with [facility] on 02/14/15: EF 55-60%, unable to determine diastolic function -Monitor intake and output, daily weights -UOP over past 24 hours: 1001 (net -442).  Weight down 7kg since admission -Will hold lasix today given BP has been soft, slight elevation in creatinine.  Anemia of chronic disease -Hemoglobin appears to be 8.6 (baseline 7-8) -Patient was transfused 1 unit PRBC -Continue monitor CBC  Atrial fibrillation -CHADSVASC 3 (based on Age, CHF, gender) -Continue Eliquis for anticoagulation -Continue Cardizem, metoprolol -increased cardizem to 240mg  daily  -Patient states she was taking cardizem 180mg  BID, but I explained to that we are using an extended release formulation   Acute renal failure -Creatinine was 1.58 upon admission, currently 1.43 -Baseline approximately 1.1  COPD -Currently not wheezing -Continue home medications, nebulizer treatments, steroids, Symbicort, Spiriva  Stage II pressure ulcer -Continue wound care- recommended low air mattress, silocone foam, restribution pad  Hypokalemia -Resolved  Code Status: Full  Family Communication: None at bedside.  Disposition Plan: Admitted. Continue treatment. Likely discharge 2/28  Time Spent in minutes   30 minutes  Procedures  None  Consults   None  DVT Prophylaxis  Eliquis  Lab Results  Component Value Date   PLT 429* 03/19/2015    Medications  Scheduled Meds: .  antiseptic oral rinse  7 mL Mouth Rinse BID  . apixaban  5 mg Oral BID  . atorvastatin  10 mg Oral q1800  . budesonide-formoterol  2 puff Inhalation BID  . ceFEPime (MAXIPIME) IV  2 g Intravenous Q24H  . cholecalciferol  5,000 Units Oral Daily  . diltiazem  240 mg Oral Daily  . famotidine  20 mg Oral Daily  . furosemide  60 mg Intravenous BID  . Gerhardt's butt cream   Topical BID  . guaiFENesin  600 mg Oral BID  . iron polysaccharides  150 mg Oral BID  . levalbuterol   0.63 mg Nebulization TID  . levothyroxine  75 mcg Oral QAC breakfast  . loratadine  10 mg Oral Daily  . Melatonin  4.5 mg Oral QHS  . metoprolol succinate  150 mg Oral Daily  . polyethylene glycol  17 g Oral Daily  . sodium chloride flush  3 mL Intravenous Q12H  . tiotropium  18 mcg Inhalation Daily  . vancomycin  750 mg Intravenous Q24H   Continuous Infusions:  PRN Meds:.acetaminophen, albuterol, calcium carbonate, docusate sodium, triamcinolone  Antibiotics    Anti-infectives    Start     Dose/Rate Route Frequency Ordered Stop   03/16/15 1200  vancomycin (VANCOCIN) IVPB 750 mg/150 ml premix     750 mg 150 mL/hr over 60 Minutes Intravenous Every 24 hours 03/15/15 1032 03/23/15 1159   03/16/15 1030  ceFEPIme (MAXIPIME) 2 g in dextrose 5 % 50 mL IVPB  Status:  Discontinued     2 g 100 mL/hr over 30 Minutes Intravenous Every 24 hours 03/15/15 1032 03/15/15 1033   03/15/15 1100  ceFEPIme (MAXIPIME) 2 g in dextrose 5 % 50 mL IVPB     2 g 100 mL/hr over 30 Minutes Intravenous Every 24 hours 03/15/15 1033 03/23/15 1059   03/15/15 1030  ceFEPIme (MAXIPIME) 2 g in dextrose 5 % 50 mL IVPB  Status:  Discontinued     2 g 100 mL/hr over 30 Minutes Intravenous Every 24 hours 03/15/15 1020 03/15/15 1032   03/15/15 1015  vancomycin (VANCOCIN) 1,500 mg in sodium chloride 0.9 % 500 mL IVPB     1,500 mg 250 mL/hr over 120 Minutes Intravenous  Once 03/15/15 1012 03/15/15 1503   03/15/15 1000  oseltamivir (TAMIFLU) capsule 30 mg  Status:  Discontinued     30 mg Oral 2 times daily 03/15/15 0930 03/16/15 0936   03/15/15 0915  ceFEPIme (MAXIPIME) 1 g in dextrose 5 % 50 mL IVPB  Status:  Discontinued     1 g 100 mL/hr over 30 Minutes Intravenous 3 times per day 03/15/15 0914 03/15/15 1020      Subjective:   Brittany Snow seen and examined today.  Patient feels breathing and cough are better today.  Denies chest pain, abdominal pain, nausea or vomiting.  She complains of sacral pain.  Feels her leg  swelling is improving.  Objective:   Filed Vitals:   03/19/15 0451 03/19/15 0500 03/19/15 0836 03/19/15 0853  BP: 94/56  90/55   Pulse: 113  93   Temp: 97.4 F (36.3 C)  97.5 F (36.4 C)   TempSrc: Oral  Oral   Resp: 20  20   Height:      Weight:  75.66 kg (166 lb 12.8 oz)    SpO2: 94%  95% 95%    Wt Readings from Last 3 Encounters:  03/19/15 75.66 kg (166 lb 12.8 oz)  Intake/Output Summary (Last 24 hours) at 03/19/15 1329 Last data filed at 03/19/15 0846  Gross per 24 hour  Intake    240 ml  Output   1302 ml  Net  -1062 ml    Exam  General: Well developed, well nourished, no distress  HEENT: NCAT, mucous membranes moist.   Cardiovascular: S1 S2 auscultated, irregularly irregular  Respiratory: Diminished but clear to auscultation   Abdomen: Soft, nontender, nondistended, + bowel sounds  Extremities: warm dry without cyanosis clubbing. LE edema improving.  Neuro: AAOx3, nonfocal  Psych: Normal affect and demeanor, pleasant  Data Review   Micro Results Recent Results (from the past 240 hour(s))  Culture, blood (routine x 2) Call MD if unable to obtain prior to antibiotics being given     Status: None (Preliminary result)   Collection Time: 03/15/15  9:25 AM  Result Value Ref Range Status   Specimen Description BLOOD RIGHT FOREARM  Final   Special Requests BOTTLES DRAWN AEROBIC AND ANAEROBIC 5CCS  Final   Culture NO GROWTH 3 DAYS  Final   Report Status PENDING  Incomplete  Culture, blood (routine x 2) Call MD if unable to obtain prior to antibiotics being given     Status: None (Preliminary result)   Collection Time: 03/15/15  9:31 AM  Result Value Ref Range Status   Specimen Description BLOOD RIGHT HAND  Final   Special Requests BOTTLES DRAWN AEROBIC ONLY 10CCS  Final   Culture NO GROWTH 3 DAYS  Final   Report Status PENDING  Incomplete  MRSA PCR Screening     Status: None   Collection Time: 03/15/15  9:43 PM  Result Value Ref Range Status   MRSA  by PCR NEGATIVE NEGATIVE Final    Comment:        The GeneXpert MRSA Assay (FDA approved for NASAL specimens only), is one component of a comprehensive MRSA colonization surveillance program. It is not intended to diagnose MRSA infection nor to guide or monitor treatment for MRSA infections.     Radiology Reports Dg Chest Port 1 View  03/19/2015  CLINICAL DATA:  Shortness of breath, history of COPD, CHF, and previous episodes of pneumonia. EXAM: PORTABLE CHEST 1 VIEW COMPARISON:  Portable chest x-ray of March 15, 2015 FINDINGS: The lungs remain well-expanded. The interstitial markings remain increased but have improved significantly. Retrocardiac density on the left likely reflects a hiatal hernia no associated atelectasis or infiltrate is not excluded. The heart is mildly enlarged though stable. The pulmonary vascularity is normal. The mediastinum is normal in width. There is gentle dextrocurvature centered in the lower thoracic spine. IMPRESSION: COPD. Improving pulmonary interstitial edema or pneumonia. Stable mild cardiomegaly without pulmonary vascular congestion. Probable hiatal hernia. Electronically Signed   By: David  Martinique M.D.   On: 03/19/2015 09:19   Dg Chest Port 1 View  03/15/2015  CLINICAL DATA:  Acute onset of worsening shortness of breath and severe generalized weakness. Initial encounter. EXAM: PORTABLE CHEST 1 VIEW COMPARISON:  Chest radiograph performed 12/06/2013 FINDINGS: The lungs are relatively well expanded. Patchy bibasilar airspace opacities may reflect pulmonary edema or pneumonia. No definite pleural effusion or pneumothorax is seen. Vascular congestion is noted. The cardiomediastinal silhouette is mildly enlarged. No acute osseous abnormalities are identified. IMPRESSION: Vascular congestion and mild cardiomegaly. Patchy bibasilar airspace opacities may reflect pulmonary edema or pneumonia. Electronically Signed   By: Garald Balding M.D.   On: 03/15/2015 07:02     CBC  Recent Labs Lab 03/15/15 [phone]  03/16/15 0506 03/17/15 0407 03/18/15 0404 03/19/15 0508  WBC 5.9 5.2 5.8 6.3 7.1  HGB 7.9* 8.6* 8.6* 8.1* 8.6*  HCT 25.7* 27.6* 27.4* 27.3* 29.1*  PLT 278 259 312 345 429*  MCV 79.1 81.2 81.3 81.5 81.5  MCH 24.3* 25.3* 25.5* 24.2* 24.1*  MCHC 30.7 31.2 31.4 29.7* 29.6*  RDW 24.1* 22.6* 22.6* 23.0* 23.5*  LYMPHSABS 0.4*  --   --   --   --   MONOABS 0.3  --   --   --   --   EOSABS 0.1  --   --   --   --   BASOSABS 0.0  --   --   --   --     Chemistries   Recent Labs Lab 03/15/15 0647 03/16/15 0506 03/17/15 0407 03/18/15 0404 03/19/15 0508  NA 135 138 139 140 140  K 4.4 3.5 3.2* 4.2 3.3*  CL 97* 100* 98* 94* 91*  CO2 27 29 33* 35* 36*  GLUCOSE 110* 85 84 93 97  BUN 31* 26* 23* 26* 24*  CREATININE 1.58* 1.28* 1.26* 1.31* 1.43*  CALCIUM 8.1* 7.6* 7.9* 8.4* 8.9  AST 19  --   --   --   --   ALT 17  --   --   --   --   ALKPHOS 48  --   --   --   --   BILITOT 0.8  --   --   --   --    ------------------------------------------------------------------------------------------------------------------ estimated creatinine clearance is 31.4 mL/min (by C-G formula based on Cr of 1.43). ------------------------------------------------------------------------------------------------------------------ No results for input(s): HGBA1C in the last 72 hours. ------------------------------------------------------------------------------------------------------------------ No results for input(s): CHOL, HDL, LDLCALC, TRIG, CHOLHDL, LDLDIRECT in the last 72 hours. ------------------------------------------------------------------------------------------------------------------ No results for input(s): TSH, T4TOTAL, T3FREE, THYROIDAB in the last 72 hours.  Invalid input(s): FREET3 ------------------------------------------------------------------------------------------------------------------ No results for input(s): VITAMINB12, FOLATE,  FERRITIN, TIBC, IRON, RETICCTPCT in the last 72 hours.  Coagulation profile No results for input(s): INR, PROTIME in the last 168 hours.  No results for input(s): DDIMER in the last 72 hours.  Cardiac Enzymes No results for input(s): CKMB, TROPONINI, MYOGLOBIN in the last 168 hours.  Invalid input(s): CK ------------------------------------------------------------------------------------------------------------------ Invalid input(s): POCBNP    Luva Metzger D.O. on 03/19/2015 at 1:29 PM  Between 7am to 7pm - Pager - [phone]  After 7pm go to [email or web address] - password [email or web address]  And look for the night coverage person covering for me after hours  [facility] Office  [phone]

## 2015-03-19 NOTE — Care Management Important Message (Signed)
Important Message  Patient Details  Name: Brittany Snow MRN: [id number] Date of Birth: [birth year]   Medicare Important Message Given:  Yes    Brittany Snow 03/19/2015, 4:50 PM

## 2015-03-19 NOTE — Consult Note (Signed)
WOC wound consult note Reason for Consult: sacral pressure injury Wound type: Stage 2 Pressure Injury Pressure Ulcer POA: Yes Measurement: medial; 1.5cm x 1.0cm x 0.2;  Lateral/distal 1.0cm x 2.0cm x 0.2cm  Wound bed: partial thickness skin loss, epithelial buds, clean, moist Drainage (amount, consistency, odor) minimal; serosanguinous  Periwound: intact  Dressing procedure/placement/frequency: Add Gerhardt's butt cream for barrier protection.  Continue silicone foam to protect and insulate. Add chair pressure redistribution pad when up in the chair.  Add low air loss mattress for moisture management and pressure redistribution due to patient's current level of pain with ulcers.   Discussed POC with patient and bedside nurse.  Re consult if needed, will not follow at this time. Thanks  Noel Rodier Kellogg, [place] [phone])

## 2015-03-19 NOTE — Progress Notes (Signed)
Physical Therapy Treatment Patient Details Name: Brittany Snow MRN: [id number] DOB: 03-Oct-1940 Today's Date: 03/19/2015    History of Present Illness Patient is a 75 yo female admitted 03/15/15 with hypoxia, productive cough, and fever.  Patient with HCAP, acute on chronic resp failure.  Patient with recent hospitalization with similar symptoms and d/c to SNF on 03/04/15 per chart.    PMH:  COPD on home O2 at 3 l/min, CHF, Afib, anemia    PT Comments    Pt making fair progress towards goals. Ambulation distance limited by fatigue and decreased O2 saturation(87%) with increase in HR(140). Pt eager to participate in therapy. Educated on [place] to reduce tension. Overall pt requiring Min A for all mobility and transfers. Recommending SNF upon D/C as pt was Independent PTA and is now requiring +1 physical assist.    Follow Up Recommendations  SNF;Supervision for mobility/OOB     Equipment Recommendations  None recommended by PT    Recommendations for Other Services       Precautions / Restrictions Precautions Precautions: Fall Precaution Comments: monitor O2 Restrictions Weight Bearing Restrictions: No    Mobility  Bed Mobility               General bed mobility comments: up in chair  Transfers Overall transfer level: Needs assistance Equipment used:  (Oxygen tank holder) Transfers: Sit to/from Stand Sit to Stand: Min assist         General transfer comment: VCs for hand palcement and Min A for initial boost and stabilization upon standing  Ambulation/Gait Ambulation/Gait assistance: Min assist Ambulation Distance (Feet): 10 Feet (3ft x 4) Assistive device:  (Oxygen tank holder) Gait Pattern/deviations: Step-to pattern;Decreased stride length;Shuffle Gait velocity: decreased Gait velocity interpretation: Below normal speed for age/gender General Gait Details: VCs fro safety, slow gait. O2 dropping to 87% on 2L O2 HR 140s, sitting break with cues for  breathing, repeated x4. Stretched for neck and scapular squeezes during sitting breaks to relax shoulders   Stairs            Wheelchair Mobility    Modified Rankin (Stroke Patients Only)       Balance Overall balance assessment: Needs assistance   Sitting balance-Leahy Scale: Tsao     Standing balance support: Bilateral upper extremity supported;During functional activity Standing balance-Leahy Scale: Fair                      Cognition Arousal/Alertness: Awake/alert Behavior During Therapy: WFL for tasks assessed/performed Overall Cognitive Status: Within Functional Limits for tasks assessed                      Exercises Other Exercises Other Exercises: Scapular retractions in sitting with 10 seconds holds x 5 Other Exercises: L Levator scap stretcch with overpressure with R hand in sitting 30-45 second hold x 2    General Comments General comments (skin integrity, edema, etc.): Reviewed pursed lip breathing      Pertinent Vitals/Pain Pain Assessment: No/denies pain    Home Living                      Prior Function            PT Goals (current goals can now be found in the care plan section) Acute Rehab PT Goals Patient Stated Goal: get better Progress towards PT goals: Progressing toward goals    Frequency  Min 3X/week    PT  Plan Current plan remains appropriate    Co-evaluation             End of Session Equipment Utilized During Treatment: Gait belt;Oxygen Activity Tolerance: Patient limited by fatigue Patient left: in chair;with call bell/phone within reach     Time: [id number] PT Time Calculation (min) (ACUTE ONLY): 23 min  Charges:  $Gait Training: 8-22 mins $Therapeutic Exercise: 8-22 mins                    G Codes:      Ara Kussmaul Mar 21, 2015, 12:08 PM  Ara Kussmaul, Student Physical Therapist Acute Rehab [phone]

## 2015-03-20 ENCOUNTER — Inpatient Hospital Stay (HOSPITAL_COMMUNITY): Payer: Medicare Other

## 2015-03-20 LAB — CBC
HEMATOCRIT: 28.1 % — AB (ref 36.0–46.0)
HEMOGLOBIN: 8.7 g/dL — AB (ref 12.0–15.0)
MCH: 25.3 pg — AB (ref 26.0–34.0)
MCHC: 31 g/dL (ref 30.0–36.0)
MCV: 81.7 fL (ref 78.0–100.0)
Platelets: 413 10*3/uL — ABNORMAL HIGH (ref 150–400)
RBC: 3.44 MIL/uL — AB (ref 3.87–5.11)
RDW: 23.1 % — ABNORMAL HIGH (ref 11.5–15.5)
WBC: 6.9 10*3/uL (ref 4.0–10.5)

## 2015-03-20 LAB — BASIC METABOLIC PANEL
Anion gap: 10 (ref 5–15)
BUN: 24 mg/dL — AB (ref 6–20)
CALCIUM: 8.5 mg/dL — AB (ref 8.9–10.3)
CHLORIDE: 94 mmol/L — AB (ref 101–111)
CO2: 36 mmol/L — ABNORMAL HIGH (ref 22–32)
CREATININE: 1.42 mg/dL — AB (ref 0.44–1.00)
GFR calc Af Amer: 41 mL/min — ABNORMAL LOW (ref >60)
GFR calc non Af Amer: 35 mL/min — ABNORMAL LOW (ref >60)
Glucose, Bld: 108 mg/dL — ABNORMAL HIGH (ref 65–99)
Potassium: 3.3 mmol/L — ABNORMAL LOW (ref 3.5–5.1)
SODIUM: 140 mmol/L (ref 135–145)

## 2015-03-20 LAB — CULTURE, BLOOD (ROUTINE X 2)
Culture: NO GROWTH
Culture: NO GROWTH

## 2015-03-20 MED ORDER — LEVALBUTEROL HCL 0.63 MG/3ML IN NEBU
0.6300 mg | INHALATION_SOLUTION | Freq: Two times a day (BID) | RESPIRATORY_TRACT | Status: DC
  Administered 2015-03-20 – 2015-03-22 (×4): 0.63 mg via RESPIRATORY_TRACT
  Filled 2015-03-20 (×4): qty 3

## 2015-03-20 MED ORDER — POTASSIUM CHLORIDE CRYS ER 20 MEQ PO TBCR
40.0000 meq | EXTENDED_RELEASE_TABLET | Freq: Once | ORAL | Status: AC
  Administered 2015-03-20: 40 meq via ORAL
  Filled 2015-03-20: qty 2

## 2015-03-20 MED ORDER — DILTIAZEM HCL ER 180 MG PO CP24
180.0000 mg | ORAL_CAPSULE | Freq: Every day | ORAL | Status: DC
  Administered 2015-03-20 – 2015-03-21 (×2): 180 mg via ORAL
  Filled 2015-03-20 (×4): qty 1

## 2015-03-20 MED ORDER — LEVOFLOXACIN 750 MG PO TABS
750.0000 mg | ORAL_TABLET | ORAL | Status: DC
  Administered 2015-03-20: 750 mg via ORAL
  Filled 2015-03-20: qty 1

## 2015-03-20 NOTE — Progress Notes (Signed)
Pharmacy Antibiotic Note BURLEY BARTOLUCCI is a 75 y.o. female admitted from SNF on 03/15/2015 with worsening SOB and concern for PNA vs CHF. Recent hospitalization at OSH for similar early this month.  Plan: -Levofloxacin 750mg  PO q48h starting today. Stop date entered for 3/3 as this will be 8 total days of antibiotics. -pharmacy to sign off as no dose adjustments anticipated   Weight: 175 lb 4.3 oz (79.5 kg)  Temp (24hrs), Avg:100.4 F (38 C), Min:99.5 F (37.5 C), Max:101.3 F (38.5 C)   Recent Labs Lab 03/18/15  WBC 6.3  CREATININE 1.31     No Known Allergies  Antimicrobials this admission: 2/23 Cefepime >> 2/28 2/23 Vancomycin >> 2/28 2/23 Tamiflu >> 2/24 LVQ 2/28>>  Dose adjustments this admission: n/a  Microbiology results: Flu neg 2/23 MRSA PCR neg 2/23 Blood: ngtd  Thank you for allowing pharmacy to be a part of this patient's care.  Ander Purpura D. Selin Eisler, PharmD, BCPS Clinical Pharmacist Pager: [phone] 03/20/2015 1:00 PM

## 2015-03-20 NOTE — Progress Notes (Signed)
[facility]                                                                              Patient Demographics  Brittany Snow, is a 75 y.o. female, DOB - 09-28-1940, PO:[id number]  Admit date - 03/15/2015   Admitting Physician Albertine Patricia, MD  Outpatient Primary MD for the patient is No primary care provider on file.  LOS - 5   Chief Complaint  Patient presents with  . Shortness of Breath      HPI on 03/15/2015 by Dr. Phillips Climes Brittany Snow is a 75 y.o. female, with past medical history of COPD, on 3 L nasal cannula, history of atrial fibrillation on Eliquis, OSA noncompliant with C Pap, diastolic CHF, chronic anemia, recent hospitalization at [facility] for respiratory failure secondary to COPD exacerbation, CHF exacerbation, and pneumonia, discharged to SNF, at facility patient was noticed to have hypoxia, as well she reports productive cough, green phlegm over the last few days, in ED patient requiring NRB, fever 101.3, no leukocytosis, his x-ray significant for multiple opacities significant for PE versus pneumonia, known baseline anemia with hemoglobin of 7.9(was 7.4 on discharge from [facility] on 2/12), was an A. fib with heart rate in 110s, she reports worsening lower extremity edema over few days as well, received IV Lasix in ED, and I was called to admit.  Assessment & Plan   Sepsis secondary to HCAP -Upon admission patient was febrile with tachycardia, tachypnea -Chest x-ray showed bilateral opacities, edema versus infection -Influenza panel negative -Initially placed on vancomycin and cefepime, transitioned to Levaquin -Blood cultures show no growth to date -Strep pneumonia urine antigen negative  Acute on chronic respiratory failure -Likely multifactorial including COPD versus pneumonia versus heart failure -Patient uses approximately 3 L of oxygen at home -Patient did require nonrebreather upon admission -Improving, curently on 2L O2 -PT consulted  and recommended SNF  Acute diastolic heart failure exacerbation -Recent echocardiogram with [facility] on 02/14/15: EF 55-60%, unable to determine diastolic function -Monitor intake and output, daily weights -UOP over past 24 hours: 1001 (net -442).  Weight down 7kg since admission -Will hold lasix today given BP has been soft, slight elevation in creatinine.  Anemia of chronic disease -Hemoglobin appears to be 8.7 (baseline 7-8) -Patient was transfused 1 unit PRBC -Continue monitor CBC  Atrial fibrillation -CHADSVASC 3 (based on Age, CHF, gender) -Continue Eliquis for anticoagulation -Continue Cardizem, metoprolol -Patient states she was taking cardizem 180mg  BID, but I explained to that we are using an extended release formulation   Acute renal failure  -Creatinine was 1.58 upon admission, currently 1.42 -Baseline approximately 1.1 -Renal US pending  COPD -Currently not wheezing -Continue home medications, nebulizer treatments, steroids, Symbicort, Spiriva  Stage II pressure ulcer -Continue wound care- recommended low air mattress, silocone foam, restribution pad  Hypokalemia -Will replace and continue to monitor  Code Status: Full  Family Communication: None at bedside.  Disposition Plan: Admitted. Continue treatment. Likely discharge to SNF in 24-48 hours  Time Spent in minutes   30 minutes  Procedures  None  Consults   None  DVT Prophylaxis  Eliquis  Lab Results  Component Value Date  PLT 413* 03/20/2015    Medications  Scheduled Meds: . antiseptic oral rinse  7 mL Mouth Rinse BID  . apixaban  5 mg Oral BID  . atorvastatin  10 mg Oral q1800  . budesonide-formoterol  2 puff Inhalation BID  . cholecalciferol  5,000 Units Oral Daily  . diltiazem  180 mg Oral Daily  . famotidine  20 mg Oral Daily  . Gerhardt's butt cream   Topical BID  . guaiFENesin  600 mg Oral BID  . iron polysaccharides  150 mg Oral BID  . levalbuterol  0.63 mg Nebulization  BID  . levofloxacin  750 mg Oral Q48H  . levothyroxine  75 mcg Oral QAC breakfast  . loratadine  10 mg Oral Daily  . Melatonin  4.5 mg Oral QHS  . metoprolol succinate  150 mg Oral Daily  . polyethylene glycol  17 g Oral Daily  . sodium chloride flush  3 mL Intravenous Q12H  . tiotropium  18 mcg Inhalation Daily   Continuous Infusions:  PRN Meds:.acetaminophen, albuterol, calcium carbonate, docusate sodium, triamcinolone  Antibiotics    Anti-infectives    Start     Dose/Rate Route Frequency Ordered Stop   03/20/15 1330  levofloxacin (LEVAQUIN) tablet 750 mg     750 mg Oral Every 48 hours 03/20/15 1258 03/23/15 2359   03/16/15 1200  vancomycin (VANCOCIN) IVPB 750 mg/150 ml premix  Status:  Discontinued     750 mg 150 mL/hr over 60 Minutes Intravenous Every 24 hours 03/15/15 1032 03/20/15 1258   03/16/15 1030  ceFEPIme (MAXIPIME) 2 g in dextrose 5 % 50 mL IVPB  Status:  Discontinued     2 g 100 mL/hr over 30 Minutes Intravenous Every 24 hours 03/15/15 1032 03/15/15 1033   03/15/15 1100  ceFEPIme (MAXIPIME) 2 g in dextrose 5 % 50 mL IVPB  Status:  Discontinued     2 g 100 mL/hr over 30 Minutes Intravenous Every 24 hours 03/15/15 1033 03/20/15 1258   03/15/15 1030  ceFEPIme (MAXIPIME) 2 g in dextrose 5 % 50 mL IVPB  Status:  Discontinued     2 g 100 mL/hr over 30 Minutes Intravenous Every 24 hours 03/15/15 1020 03/15/15 1032   03/15/15 1015  vancomycin (VANCOCIN) 1,500 mg in sodium chloride 0.9 % 500 mL IVPB     1,500 mg 250 mL/hr over 120 Minutes Intravenous  Once 03/15/15 1012 03/15/15 1503   03/15/15 1000  oseltamivir (TAMIFLU) capsule 30 mg  Status:  Discontinued     30 mg Oral 2 times daily 03/15/15 0930 03/16/15 0936   03/15/15 0915  ceFEPIme (MAXIPIME) 1 g in dextrose 5 % 50 mL IVPB  Status:  Discontinued     1 g 100 mL/hr over 30 Minutes Intravenous 3 times per day 03/15/15 0914 03/15/15 1020      Subjective:   [name] seen and examined today.  Patient feels  breathing and cough are better today.  She is worried about her low blood pressure.   Denies chest pain, abdominal pain, nausea or vomiting.   Objective:   Filed Vitals:   03/19/15 2107 03/20/15 0608 03/20/15 0748 03/20/15 0751  BP: 91/57 101/52 90/50   Pulse: 89 88 101   Temp: 98.5 F (36.9 C) 97.7 F (36.5 C) 97.6 F (36.4 C)   TempSrc: Oral Oral Oral   Resp: 18 18 18    Height:      Weight:      SpO2: 100% 97% 94% 94%  Wt Readings from Last 3 Encounters:  03/19/15 75.66 kg (166 lb 12.8 oz)     Intake/Output Summary (Last 24 hours) at 03/20/15 1411 Last data filed at 03/20/15 1007  Gross per 24 hour  Intake    240 ml  Output      0 ml  Net    240 ml    Exam  General: Well developed, well nourished, no distress  HEENT: NCAT, mucous membranes moist.   Cardiovascular: S1 S2 auscultated, irregularly irregular  Respiratory: Diminished but clear to auscultation, no wheezing   Abdomen: Soft, nontender, nondistended, + bowel sounds  Extremities: warm dry without cyanosis clubbing. LE edema improving.  Neuro: AAOx3, nonfocal  Psych: Normal affect and demeanor, pleasant  Data Review   Micro Results Recent Results (from the past 240 hour(s))  Culture, blood (routine x 2) Call MD if unable to obtain prior to antibiotics being given     Status: None (Preliminary result)   Collection Time: 03/15/15  9:25 AM  Result Value Ref Range Status   Specimen Description BLOOD RIGHT FOREARM  Final   Special Requests BOTTLES DRAWN AEROBIC AND ANAEROBIC 5CCS  Final   Culture NO GROWTH 4 DAYS  Final   Report Status PENDING  Incomplete  Culture, blood (routine x 2) Call MD if unable to obtain prior to antibiotics being given     Status: None (Preliminary result)   Collection Time: 03/15/15  9:31 AM  Result Value Ref Range Status   Specimen Description BLOOD RIGHT HAND  Final   Special Requests BOTTLES DRAWN AEROBIC ONLY 10CCS  Final   Culture NO GROWTH 4 DAYS  Final   Report  Status PENDING  Incomplete  MRSA PCR Screening     Status: None   Collection Time: 03/15/15  9:43 PM  Result Value Ref Range Status   MRSA by PCR NEGATIVE NEGATIVE Final    Comment:        The GeneXpert MRSA Assay (FDA approved for NASAL specimens only), is one component of a comprehensive MRSA colonization surveillance program. It is not intended to diagnose MRSA infection nor to guide or monitor treatment for MRSA infections.     Radiology Reports Dg Chest Port 1 View  03/19/2015  CLINICAL DATA:  Shortness of breath, history of COPD, CHF, and previous episodes of pneumonia. EXAM: PORTABLE CHEST 1 VIEW COMPARISON:  Portable chest x-ray of March 15, 2015 FINDINGS: The lungs remain well-expanded. The interstitial markings remain increased but have improved significantly. Retrocardiac density on the left likely reflects a hiatal hernia no associated atelectasis or infiltrate is not excluded. The heart is mildly enlarged though stable. The pulmonary vascularity is normal. The mediastinum is normal in width. There is gentle dextrocurvature centered in the lower thoracic spine. IMPRESSION: COPD. Improving pulmonary interstitial edema or pneumonia. Stable mild cardiomegaly without pulmonary vascular congestion. Probable hiatal hernia. Electronically Signed   By: David  Martinique M.D.   On: 03/19/2015 09:19   Dg Chest Port 1 View  03/15/2015  CLINICAL DATA:  Acute onset of worsening shortness of breath and severe generalized weakness. Initial encounter. EXAM: PORTABLE CHEST 1 VIEW COMPARISON:  Chest radiograph performed 12/06/2013 FINDINGS: The lungs are relatively well expanded. Patchy bibasilar airspace opacities may reflect pulmonary edema or pneumonia. No definite pleural effusion or pneumothorax is seen. Vascular congestion is noted. The cardiomediastinal silhouette is mildly enlarged. No acute osseous abnormalities are identified. IMPRESSION: Vascular congestion and mild cardiomegaly. Patchy  bibasilar airspace opacities may reflect pulmonary edema or  pneumonia. Electronically Signed   By: Garald Balding M.D.   On: 03/15/2015 07:02    CBC  Recent Labs Lab 03/15/15 [id number] 03/16/15 0506 03/17/15 0407 03/18/15 0404 03/19/15 0508 03/20/15 0705  WBC 5.9 5.2 5.8 6.3 7.1 6.9  HGB 7.9* 8.6* 8.6* 8.1* 8.6* 8.7*  HCT 25.7* 27.6* 27.4* 27.3* 29.1* 28.1*  PLT 278 259 312 345 429* 413*  MCV 79.1 81.2 81.3 81.5 81.5 81.7  MCH 24.3* 25.3* 25.5* 24.2* 24.1* 25.3*  MCHC 30.7 31.2 31.4 29.7* 29.6* 31.0  RDW 24.1* 22.6* 22.6* 23.0* 23.5* 23.1*  LYMPHSABS 0.4*  --   --   --   --   --   MONOABS 0.3  --   --   --   --   --   EOSABS 0.1  --   --   --   --   --   BASOSABS 0.0  --   --   --   --   --     Chemistries   Recent Labs Lab 03/15/15 0647 03/16/15 0506 03/17/15 0407 03/18/15 0404 03/19/15 0508 03/20/15 0705  NA 135 138 139 140 140 140  K 4.4 3.5 3.2* 4.2 3.3* 3.3*  CL 97* 100* 98* 94* 91* 94*  CO2 27 29 33* 35* 36* 36*  GLUCOSE 110* 85 84 93 97 108*  BUN 31* 26* 23* 26* 24* 24*  CREATININE 1.58* 1.28* 1.26* 1.31* 1.43* 1.42*  CALCIUM 8.1* 7.6* 7.9* 8.4* 8.9 8.5*  AST 19  --   --   --   --   --   ALT 17  --   --   --   --   --   ALKPHOS 48  --   --   --   --   --   BILITOT 0.8  --   --   --   --   --    ------------------------------------------------------------------------------------------------------------------ estimated creatinine clearance is 31.6 mL/min (by C-G formula based on Cr of 1.42). ------------------------------------------------------------------------------------------------------------------ No results for input(s): HGBA1C in the last 72 hours. ------------------------------------------------------------------------------------------------------------------ No results for input(s): CHOL, HDL, LDLCALC, TRIG, CHOLHDL, LDLDIRECT in the last 72  hours. ------------------------------------------------------------------------------------------------------------------ No results for input(s): TSH, T4TOTAL, T3FREE, THYROIDAB in the last 72 hours.  Invalid input(s): FREET3 ------------------------------------------------------------------------------------------------------------------ No results for input(s): VITAMINB12, FOLATE, FERRITIN, TIBC, IRON, RETICCTPCT in the last 72 hours.  Coagulation profile No results for input(s): INR, PROTIME in the last 168 hours.  No results for input(s): DDIMER in the last 72 hours.  Cardiac Enzymes No results for input(s): CKMB, TROPONINI, MYOGLOBIN in the last 168 hours.  Invalid input(s): CK ------------------------------------------------------------------------------------------------------------------ Invalid input(s): POCBNP    Jinx Gilden D.O. on 03/20/2015 at 2:11 PM  Between 7am to 7pm - Pager - [phone]  After 7pm go to [email or web address] - password [email or web address]  And look for the night coverage person covering for me after hours  [facility] Office  [phone]

## 2015-03-20 NOTE — Plan of Care (Signed)
Problem: Phase II Progression Outcomes Goal: O2 sats > equal to 90% on RA or at baseline Outcome: Completed/Met Date Met:  03/20/15 Baseline 2L O2

## 2015-03-21 ENCOUNTER — Encounter (HOSPITAL_COMMUNITY): Payer: Self-pay | Admitting: *Deleted

## 2015-03-21 LAB — BASIC METABOLIC PANEL
Anion gap: 9 (ref 5–15)
BUN: 20 mg/dL (ref 6–20)
CO2: 33 mmol/L — ABNORMAL HIGH (ref 22–32)
CREATININE: 1.35 mg/dL — AB (ref 0.44–1.00)
Calcium: 8.4 mg/dL — ABNORMAL LOW (ref 8.9–10.3)
Chloride: 98 mmol/L — ABNORMAL LOW (ref 101–111)
GFR calc Af Amer: 44 mL/min — ABNORMAL LOW (ref >60)
GFR, EST NON AFRICAN AMERICAN: 38 mL/min — AB (ref >60)
GLUCOSE: 92 mg/dL (ref 65–99)
Potassium: 4 mmol/L (ref 3.5–5.1)
SODIUM: 140 mmol/L (ref 135–145)

## 2015-03-21 LAB — CBC
HCT: 27.7 % — ABNORMAL LOW (ref 36.0–46.0)
Hemoglobin: 8.1 g/dL — ABNORMAL LOW (ref 12.0–15.0)
MCH: 24.4 pg — AB (ref 26.0–34.0)
MCHC: 29.2 g/dL — AB (ref 30.0–36.0)
MCV: 83.4 fL (ref 78.0–100.0)
PLATELETS: 463 10*3/uL — AB (ref 150–400)
RBC: 3.32 MIL/uL — AB (ref 3.87–5.11)
RDW: 23.5 % — ABNORMAL HIGH (ref 11.5–15.5)
WBC: 8.1 10*3/uL (ref 4.0–10.5)

## 2015-03-21 LAB — TSH: TSH: 4.326 u[IU]/mL (ref 0.350–4.500)

## 2015-03-21 MED ORDER — GUAIFENESIN ER 600 MG PO TB12
1200.0000 mg | ORAL_TABLET | Freq: Two times a day (BID) | ORAL | Status: DC
  Administered 2015-03-21 – 2015-03-22 (×2): 1200 mg via ORAL
  Filled 2015-03-21 (×2): qty 2

## 2015-03-21 MED ORDER — DILTIAZEM HCL ER COATED BEADS 240 MG PO CP24
240.0000 mg | ORAL_CAPSULE | Freq: Every day | ORAL | Status: DC
  Administered 2015-03-22: 240 mg via ORAL
  Filled 2015-03-21: qty 1
  Filled 2015-03-21: qty 2

## 2015-03-21 NOTE — Progress Notes (Signed)
Physical Therapy Treatment Patient Details Name: Brittany Snow MRN: [id number] DOB: Dec 24, 1940 Today's Date: 03/21/2015    History of Present Illness Patient is a 75 yo female admitted 03/15/15 with hypoxia, productive cough, and fever.  Patient with HCAP, acute on chronic resp failure.  Patient with recent hospitalization with similar symptoms and d/c to SNF on 03/04/15 per chart.    PMH:  COPD on home O2 at 3 l/min, CHF, Afib, anemia    PT Comments    Progressing towards functional goals. Ambulatory distance limited by fatigue and dyspnea. SpO2 93% on 3L supplemental O2. Practiced navigating 4 steps which she will have to ascend to enter home at d/c. Feels she will be able to manage with assist from husband. Very motivated. Will benefit from HHPT. Will continue to follow and progress until d/c.  Follow Up Recommendations  SNF;Supervision for mobility/OOB (Pt has decided to go home - recommend HHPT)     Equipment Recommendations  None recommended by PT    Recommendations for Other Services       Precautions / Restrictions Precautions Precautions: Fall Precaution Comments: monitor O2 Restrictions Weight Bearing Restrictions: No    Mobility  Bed Mobility               General bed mobility comments: up in chair  Transfers Overall transfer level: Needs assistance Equipment used: Rolling walker (2 wheeled) Transfers: Sit to/from Stand Sit to Stand: Min guard         General transfer comment: Min guard for safety. VC for hand placement. Performed x2 from recliner without physical assist.  Ambulation/Gait Ambulation/Gait assistance: Min guard Ambulation Distance (Feet): 45 Feet (+20) Assistive device: Rolling walker (2 wheeled) Gait Pattern/deviations: Step-through pattern;Decreased stride length;Trunk flexed Gait velocity: decreased Gait velocity interpretation: Below normal speed for age/gender General Gait Details: SpO2 downt to 93% on 3L supplemental O2. Required 2  standing rest breaks to complete 45 foot distance. No buckling or over loss of balance noted. VC for walker placement and upright posture with forward gaze.   Stairs Stairs: Yes Stairs assistance: Min guard Stair Management: One rail Left;Step to pattern;Sideways Number of Stairs: 4 General stair comments: Practiced similar technique to how pt enters/exits home. Educated on sideways approach to hold rail with both UEs. Completed with moderate effort, requiring several standing rest breaks to complete stair navigation but no physical assist.  Wheelchair Mobility    Modified Rankin (Stroke Patients Only)       Balance                                    Cognition Arousal/Alertness: Awake/alert Behavior During Therapy: WFL for tasks assessed/performed Overall Cognitive Status: Within Functional Limits for tasks assessed                      Exercises General Exercises - Lower Extremity Ankle Circles/Pumps: AROM;Both;10 reps;Seated    General Comments General comments (skin integrity, edema, etc.): Discussed pursed lip breathing and energy conservation techniques including use of pulse ox and monitoring for symptoms of hypoxia.      Pertinent Vitals/Pain Pain Assessment: No/denies pain    Home Living                      Prior Function            PT Goals (current goals can now be found in  the care plan section) Acute Rehab PT Goals Patient Stated Goal: get better PT Goal Formulation: With patient Time For Goal Achievement: 03/25/15 Potential to Achieve Goals: Tieu Progress towards PT goals: Progressing toward goals    Frequency  Min 3X/week    PT Plan Current plan remains appropriate    Co-evaluation             End of Session Equipment Utilized During Treatment: Gait belt;Oxygen Activity Tolerance: Patient tolerated treatment well Patient left: in chair;with call bell/phone within reach     Time: 1445-1511 PT Time  Calculation (min) (ACUTE ONLY): 26 min  Charges:  $Gait Training: 8-22 mins $Therapeutic Activity: 8-22 mins                    G Codes:      Ellouise Newer 20-Apr-2015, 3:40 PM  Camille Bal [place], [place]

## 2015-03-21 NOTE — Clinical Social Work Note (Signed)
Patient from [facility] skilled facility where she was receiving ST rehab for about a week before coming to hospital. Talked with patient about discharge plans and her decision is to go home, where she lives with her husband. Patient requesting [place] services at discharge as she is on oxygen and wants therapy in the home. Patient informed that the nurse case manager will speak with her regarding any HH needs.  CSW was contacted by MD regarding patient disposition and was informed that patient will discharge home. Per MD, patient will discharge tomorrow morning.  Josemanuel Eakins Givens, MSW, LCSW Licensed Clinical Social Worker [place] [phone]

## 2015-03-21 NOTE — Progress Notes (Signed)
[facility]                                                                             Patient Demographics Brittany Snow, is a 75 y.o. female, DOB - 12/27/1940, [id number] Admit date - 03/15/2015   Admitting Physician Albertine Patricia, MD Outpatient Primary MD for the patient is No primary care provider on file.  Subjective:   Feels OK, heart rate increased during night and she has more O2 requirement. Soft blood pressure, given fluids yesterday.  HPI on 03/15/2015 by Dr. Phillips Climes [name] is a 75 y.o. female, with past medical history of COPD, on 3 L nasal cannula, history of atrial fibrillation on Eliquis, OSA noncompliant with C Pap, diastolic CHF, chronic anemia, recent hospitalization at [facility] for respiratory failure secondary to COPD exacerbation, CHF exacerbation, and pneumonia, discharged to SNF, at facility patient was noticed to have hypoxia, as well she reports productive cough, green phlegm over the last few days, in ED patient requiring NRB, fever 101.3, no leukocytosis, his x-ray significant for multiple opacities significant for PE versus pneumonia, known baseline anemia with hemoglobin of 7.9(was 7.4 on discharge from [facility] on 2/12), was an A. fib with heart rate in 110s, she reports worsening lower extremity edema over few days as well, received IV Lasix in ED, and I was called to admit.  Assessment & Plan   Sepsis secondary to HCAP -Upon admission patient was febrile (Fever of 101.3) with tachycardia, tachypnea -Chest x-ray showed bilateral opacities, edema versus infection -Influenza panel negative -Initially placed on vancomycin and cefepime, transitioned to Levaquin -Blood cultures show no growth to date -Strep pneumonia urine antigen negative  Acute on chronic respiratory failure -Likely multifactorial including COPD versus pneumonia versus heart failure -Patient uses approximately 3 L of oxygen at home -Patient did require  nonrebreather upon admission -Improving, curently on 2L O2 -PT consulted and recommended SNF  Acute diastolic heart failure exacerbation -Recent echocardiogram with [facility] on 02/14/15: EF 55-60%, unable to determine diastolic function -Monitor intake and output, daily weights -UOP over past 24 hours: 1001 (net -442).  Weight down 7kg since admission -Will hold lasix today given BP has been soft, slight elevation in creatinine.  Anemia of chronic disease -Hemoglobin appears to be 8.7 (baseline 7-8) -Patient was transfused 1 unit PRBC -Continue monitor CBC  Atrial fibrillation -CHADSVASC 3 (based on Age, CHF, gender) -Continue Eliquis for anticoagulation -Continue Cardizem, metoprolol -Patient states she was taking cardizem 180mg  BID, but I explained to that we are using an extended release formulation   Acute renal failure  -Creatinine was 1.58 upon admission, currently 1.42 -Baseline approximately 1.1 -Renal US pending  COPD -Currently not wheezing -Continue home medications, nebulizer treatments, steroids, Symbicort, Spiriva  Stage II pressure ulcer -Continue wound care- recommended low air mattress, silocone foam, restribution pad  Hypokalemia -Will replace and continue to monitor.   Code Status: Full  Family Communication: None at bedside.  Disposition Plan: Admitted. Continue treatment. Likely discharge to SNF in 24-48 hours  Time Spent in minutes   30 minutes  Procedures  None  Consults   None  DVT Prophylaxis  Eliquis  Lab Results  Component Value  Date   PLT 463* 03/21/2015   Medications Scheduled Meds: . antiseptic oral rinse  7 mL Mouth Rinse BID  . apixaban  5 mg Oral BID  . atorvastatin  10 mg Oral q1800  . budesonide-formoterol  2 puff Inhalation BID  . cholecalciferol  5,000 Units Oral Daily  . diltiazem  180 mg Oral Daily  . famotidine  20 mg Oral Daily  . Gerhardt's butt cream   Topical BID  . guaiFENesin  600 mg Oral BID  . iron  polysaccharides  150 mg Oral BID  . levalbuterol  0.63 mg Nebulization BID  . levofloxacin  750 mg Oral Q48H  . levothyroxine  75 mcg Oral QAC breakfast  . loratadine  10 mg Oral Daily  . Melatonin  4.5 mg Oral QHS  . metoprolol succinate  150 mg Oral Daily  . polyethylene glycol  17 g Oral Daily  . sodium chloride flush  3 mL Intravenous Q12H  . tiotropium  18 mcg Inhalation Daily   Continuous Infusions:  PRN Meds:.acetaminophen, albuterol, calcium carbonate, docusate sodium, triamcinolone  Antibiotics    Anti-infectives    Start     Dose/Rate Route Frequency Ordered Stop   03/20/15 1330  levofloxacin (LEVAQUIN) tablet 750 mg     750 mg Oral Every 48 hours 03/20/15 1258 03/23/15 2359   03/16/15 1200  vancomycin (VANCOCIN) IVPB 750 mg/150 ml premix  Status:  Discontinued     750 mg 150 mL/hr over 60 Minutes Intravenous Every 24 hours 03/15/15 1032 03/20/15 1258   03/16/15 1030  ceFEPIme (MAXIPIME) 2 g in dextrose 5 % 50 mL IVPB  Status:  Discontinued     2 g 100 mL/hr over 30 Minutes Intravenous Every 24 hours 03/15/15 1032 03/15/15 1033   03/15/15 1100  ceFEPIme (MAXIPIME) 2 g in dextrose 5 % 50 mL IVPB  Status:  Discontinued     2 g 100 mL/hr over 30 Minutes Intravenous Every 24 hours 03/15/15 1033 03/20/15 1258   03/15/15 1030  ceFEPIme (MAXIPIME) 2 g in dextrose 5 % 50 mL IVPB  Status:  Discontinued     2 g 100 mL/hr over 30 Minutes Intravenous Every 24 hours 03/15/15 1020 03/15/15 1032   03/15/15 1015  vancomycin (VANCOCIN) 1,500 mg in sodium chloride 0.9 % 500 mL IVPB     1,500 mg 250 mL/hr over 120 Minutes Intravenous  Once 03/15/15 1012 03/15/15 1503   03/15/15 1000  oseltamivir (TAMIFLU) capsule 30 mg  Status:  Discontinued     30 mg Oral 2 times daily 03/15/15 0930 03/16/15 0936   03/15/15 0915  ceFEPIme (MAXIPIME) 1 g in dextrose 5 % 50 mL IVPB  Status:  Discontinued     1 g 100 mL/hr over 30 Minutes Intravenous 3 times per day 03/15/15 0914 03/15/15 1020       Objective:   Filed Vitals:   03/20/15 2037 03/20/15 2100 03/21/15 0400 03/21/15 0743  BP:  97/51 106/51   Pulse: 98 96 110   Temp:  97.6 F (36.4 C) 97.7 F (36.5 C)   TempSrc:  Oral Oral   Resp: 20 19 20    Height:      Weight:  80.2 kg (176 lb 12.9 oz)    SpO2: 98% 98% 100% 94%    Wt Readings from Last 3 Encounters:  03/20/15 80.2 kg (176 lb 12.9 oz)     Intake/Output Summary (Last 24 hours) at 03/21/15 1345 Last data filed at 03/21/15 1135  Gross per 24 hour  Intake   1360 ml  Output      0 ml  Net   1360 ml    Exam  General: Well developed, well nourished, no distress  HEENT: NCAT, mucous membranes moist.   Cardiovascular: S1 S2 auscultated, irregularly irregular  Respiratory: Diminished but clear to auscultation, no wheezing   Abdomen: Soft, nontender, nondistended, + bowel sounds  Extremities: warm dry without cyanosis clubbing. LE edema improving.  Neuro: AAOx3, nonfocal  Psych: Normal affect and demeanor, pleasant  Data Review   Micro Results Recent Results (from the past 240 hour(s))  Culture, blood (routine x 2) Call MD if unable to obtain prior to antibiotics being given     Status: None   Collection Time: 03/15/15  9:25 AM  Result Value Ref Range Status   Specimen Description BLOOD RIGHT FOREARM  Final   Special Requests BOTTLES DRAWN AEROBIC AND ANAEROBIC 5CCS  Final   Culture NO GROWTH 5 DAYS  Final   Report Status 03/20/2015 FINAL  Final  Culture, blood (routine x 2) Call MD if unable to obtain prior to antibiotics being given     Status: None   Collection Time: 03/15/15  9:31 AM  Result Value Ref Range Status   Specimen Description BLOOD RIGHT HAND  Final   Special Requests BOTTLES DRAWN AEROBIC ONLY 10CCS  Final   Culture NO GROWTH 5 DAYS  Final   Report Status 03/20/2015 FINAL  Final  MRSA PCR Screening     Status: None   Collection Time: 03/15/15  9:43 PM  Result Value Ref Range Status   MRSA by PCR NEGATIVE NEGATIVE Final     Comment:        The GeneXpert MRSA Assay (FDA approved for NASAL specimens only), is one component of a comprehensive MRSA colonization surveillance program. It is not intended to diagnose MRSA infection nor to guide or monitor treatment for MRSA infections.     Radiology Reports US Renal  03/20/2015  CLINICAL DATA:  Renal insufficiency EXAM: RENAL / URINARY TRACT ULTRASOUND COMPLETE COMPARISON:  None. FINDINGS: Right Kidney: Length: 9.4 cm. Echogenicity within normal limits. There is mild renal cortical thinning. No mass, perinephric fluid, or hydronephrosis visualized. No sonographically demonstrable calculus or ureterectasis. There is a relatively hypoechoic renal pyramid in the upper pole of the right kidney which appears benign. Left Kidney: Length: 9.6 cm. Echogenicity within normal limits. There is mild renal cortical thinning. No mass, perinephric fluid, or hydronephrosis visualized. There is a calculus in the upper pole of the left kidney measuring 5 mm. No ureterectasis. Bladder: Appears normal for degree of bladder distention. IMPRESSION: Nonobstructing 5 mm calculus upper pole left kidney. Renal cortical thinning bilaterally, a finding that may be indicative of medical renal disease. The renal echogenicity bilaterally is normal, and there is no hydronephrosis on either side. Electronically Signed   By: Lowella Grip III M.D.   On: 03/20/2015 16:23   Dg Chest Port 1 View  03/19/2015  CLINICAL DATA:  Shortness of breath, history of COPD, CHF, and previous episodes of pneumonia. EXAM: PORTABLE CHEST 1 VIEW COMPARISON:  Portable chest x-ray of March 15, 2015 FINDINGS: The lungs remain well-expanded. The interstitial markings remain increased but have improved significantly. Retrocardiac density on the left likely reflects a hiatal hernia no associated atelectasis or infiltrate is not excluded. The heart is mildly enlarged though stable. The pulmonary vascularity is normal. The  mediastinum is normal in width. There is gentle dextrocurvature  centered in the lower thoracic spine. IMPRESSION: COPD. Improving pulmonary interstitial edema or pneumonia. Stable mild cardiomegaly without pulmonary vascular congestion. Probable hiatal hernia. Electronically Signed   By: David  Martinique M.D.   On: 03/19/2015 09:19   Dg Chest Port 1 View  03/15/2015  CLINICAL DATA:  Acute onset of worsening shortness of breath and severe generalized weakness. Initial encounter. EXAM: PORTABLE CHEST 1 VIEW COMPARISON:  Chest radiograph performed 12/06/2013 FINDINGS: The lungs are relatively well expanded. Patchy bibasilar airspace opacities may reflect pulmonary edema or pneumonia. No definite pleural effusion or pneumothorax is seen. Vascular congestion is noted. The cardiomediastinal silhouette is mildly enlarged. No acute osseous abnormalities are identified. IMPRESSION: Vascular congestion and mild cardiomegaly. Patchy bibasilar airspace opacities may reflect pulmonary edema or pneumonia. Electronically Signed   By: Garald Balding M.D.   On: 03/15/2015 07:02    CBC  Recent Labs Lab 03/15/15 [id number]  03/17/15 0407 03/18/15 0404 03/19/15 0508 03/20/15 0705 03/21/15 0653  WBC 5.9  < > 5.8 6.3 7.1 6.9 8.1  HGB 7.9*  < > 8.6* 8.1* 8.6* 8.7* 8.1*  HCT 25.7*  < > 27.4* 27.3* 29.1* 28.1* 27.7*  PLT 278  < > 312 345 429* 413* 463*  MCV 79.1  < > 81.3 81.5 81.5 81.7 83.4  MCH 24.3*  < > 25.5* 24.2* 24.1* 25.3* 24.4*  MCHC 30.7  < > 31.4 29.7* 29.6* 31.0 29.2*  RDW 24.1*  < > 22.6* 23.0* 23.5* 23.1* 23.5*  LYMPHSABS 0.4*  --   --   --   --   --   --   MONOABS 0.3  --   --   --   --   --   --   EOSABS 0.1  --   --   --   --   --   --   BASOSABS 0.0  --   --   --   --   --   --   < > = values in this interval not displayed.  Chemistries   Recent Labs Lab 03/15/15 [id number]  03/17/15 0407 03/18/15 0404 03/19/15 0508 03/20/15 0705 03/21/15 0653  NA 135  < > 139 140 140 140 140  K 4.4  < > 3.2* 4.2  3.3* 3.3* 4.0  CL 97*  < > 98* 94* 91* 94* 98*  CO2 27  < > 33* 35* 36* 36* 33*  GLUCOSE 110*  < > 84 93 97 108* 92  BUN 31*  < > 23* 26* 24* 24* 20  CREATININE 1.58*  < > 1.26* 1.31* 1.43* 1.42* 1.35*  CALCIUM 8.1*  < > 7.9* 8.4* 8.9 8.5* 8.4*  AST 19  --   --   --   --   --   --   ALT 17  --   --   --   --   --   --   ALKPHOS 48  --   --   --   --   --   --   BILITOT 0.8  --   --   --   --   --   --   < > = values in this interval not displayed. ------------------------------------------------------------------------------------------------------------------ estimated creatinine clearance is 34.3 mL/min (by C-G formula based on Cr of 1.35). ------------------------------------------------------------------------------------------------------------------ No results for input(s): HGBA1C in the last 72 hours. ------------------------------------------------------------------------------------------------------------------ No results for input(s): CHOL, HDL, LDLCALC, TRIG, CHOLHDL, LDLDIRECT in the last 72 hours. ------------------------------------------------------------------------------------------------------------------ No results for input(s): TSH,  T4TOTAL, T3FREE, THYROIDAB in the last 72 hours.  Invalid input(s): FREET3 ------------------------------------------------------------------------------------------------------------------ No results for input(s): VITAMINB12, FOLATE, FERRITIN, TIBC, IRON, RETICCTPCT in the last 72 hours.  Coagulation profile No results for input(s): INR, PROTIME in the last 168 hours.  No results for input(s): DDIMER in the last 72 hours.  Cardiac Enzymes No results for input(s): CKMB, TROPONINI, MYOGLOBIN in the last 168 hours.  Invalid input(s): CK ------------------------------------------------------------------------------------------------------------------ Invalid input(s): POCBNP   Preslie Depasquale A MD on 03/21/2015 at 1:45 PM  Between 7am  to 7pm - Pager - [phone]  After 7pm go to [email or web address] - password [email or web address]  And look for the night coverage person covering for me after hours  [facility] Office  [phone]

## 2015-03-22 LAB — BASIC METABOLIC PANEL
Anion gap: 11 (ref 5–15)
BUN: 19 mg/dL (ref 6–20)
CHLORIDE: 96 mmol/L — AB (ref 101–111)
CO2: 34 mmol/L — AB (ref 22–32)
CREATININE: 1.54 mg/dL — AB (ref 0.44–1.00)
Calcium: 8.7 mg/dL — ABNORMAL LOW (ref 8.9–10.3)
GFR calc Af Amer: 37 mL/min — ABNORMAL LOW (ref >60)
GFR calc non Af Amer: 32 mL/min — ABNORMAL LOW (ref >60)
Glucose, Bld: 100 mg/dL — ABNORMAL HIGH (ref 65–99)
Potassium: 4.2 mmol/L (ref 3.5–5.1)
SODIUM: 141 mmol/L (ref 135–145)

## 2015-03-22 LAB — CBC
HCT: 26.5 % — ABNORMAL LOW (ref 36.0–46.0)
Hemoglobin: 8 g/dL — ABNORMAL LOW (ref 12.0–15.0)
MCH: 25.3 pg — AB (ref 26.0–34.0)
MCHC: 30.2 g/dL (ref 30.0–36.0)
MCV: 83.9 fL (ref 78.0–100.0)
PLATELETS: 460 10*3/uL — AB (ref 150–400)
RBC: 3.16 MIL/uL — ABNORMAL LOW (ref 3.87–5.11)
RDW: 23.6 % — AB (ref 11.5–15.5)
WBC: 7.4 10*3/uL (ref 4.0–10.5)

## 2015-03-22 MED ORDER — FUROSEMIDE 40 MG PO TABS: 40.0000 mg | ORAL_TABLET | Freq: Every day | ORAL | Status: AC

## 2015-03-22 MED ORDER — METOPROLOL SUCCINATE ER 100 MG PO TB24: 100.0000 mg | ORAL_TABLET | Freq: Every day | ORAL | Status: AC

## 2015-03-22 MED ORDER — DILTIAZEM HCL ER 180 MG PO CP24: 180.0000 mg | ORAL_CAPSULE | Freq: Two times a day (BID) | ORAL | Status: AC

## 2015-03-22 NOTE — Progress Notes (Signed)
03/22/2015 2:00 PM  Reviewed Discharge AVS with patient and her husband, including discharge instructions, medications, diet, etc.  Pt and husband asked questions appropriately and verbalized understanding of education provided.  IV and telemetry removed.  Pt was escorted out via wheelchair through the emergency room by NT.    Rich Reining, Glory Buff

## 2015-03-22 NOTE — Care Management Note (Signed)
Case Management Note  Patient Details  Name: Brittany Snow MRN: [id number] Date of Birth: [birth year]  Subjective/Objective:              CM following for progression and d/c planning.      Action/Plan: 03/22/15 Met with pt on 03/21/15 who states that she has home oxygen with [employer] and that she plans to d/c to home with husband. HH discussed and [facility] selected for HHRN and HHPT. [place] notified. PT has DME at home and husband is able to assist in the home.  Expected Discharge Date:    03/22/2015              Expected Discharge Plan:  [place]  In-House Referral:  Clinical Social Work  Discharge planning Services  CM Consult  Post Acute Care Choice:  Home Health Choice offered to:  Patient  DME Arranged:  N/A DME Agency:  NA  HH Arranged:  RN, PT [place] Agency:  [facility]  Status of Service:  Completed, signed off  Medicare Important Message Given:  Yes Date Medicare IM Given:    Medicare IM give by:    Date Additional Medicare IM Given:    Additional Medicare Important Message give by:     If discussed at [place] of Stay Meetings, dates discussed:    Additional Comments:  Adron Bene, RN 03/22/2015, 11:05 AM

## 2015-03-22 NOTE — Discharge Planning (Signed)
Patient seen by PT and recommended SNF. I recommended SNF as well to the patient and her husband at bedside. Patient wants to go home, home health services will be set up.  Birdie Hopes Pager: [phone] 03/22/2015, 12:06 PM

## 2015-03-22 NOTE — Care Management Important Message (Signed)
Important Message  Patient Details  Name: Brittany Snow MRN: [id number] Date of Birth: Feb 06, 1940   Medicare Important Message Given:  Yes    Brittany Snow [name] 03/22/2015, 2:19 PM

## 2015-03-22 NOTE — Discharge Summary (Addendum)
Physician Discharge Summary  Brittany Snow [id number] DOB: Jun 13, 1940 DOA: 03/15/2015  PCP: No primary care provider on file.  Admit date: 03/15/2015 Discharge date: 03/22/2015  Time spent: 40 minutes  Recommendations for Outpatient Follow-up:  1. Follow-up with primary care physician within one week. 2. Obtain CBC and BMP in 1 week.   Discharge Diagnoses:  Principal Problem:   Acute on chronic respiratory failure (HCC) Active Problems:   COPD (chronic obstructive pulmonary disease) (HCC)   Acute diastolic CHF (congestive heart failure) (HCC)   HCAP (healthcare-associated pneumonia)   Atrial fibrillation (HCC)   Anemia   Pressure ulcer   Discharge Condition: Stable  Diet recommendation: Heart healthy, fluid restriction to 1.5 L per day  Filed Weights   03/19/15 0500 03/20/15 2100 03/22/15 0500  Weight: 75.66 kg (166 lb 12.8 oz) 80.2 kg (176 lb 12.9 oz) 80.4 kg (177 lb 4 oz)    History of present illness:  Brittany Snow is a 75 y.o. female, with past medical history of COPD, on 3 L nasal cannula, history of atrial fibrillation on Eliquis, OSA noncompliant with C Pap, diastolic CHF, chronic anemia, recent hospitalization at [facility] for respiratory failure secondary to COPD exacerbation, CHF exacerbation, and pneumonia, discharged to SNF, at facility patient was noticed to have hypoxia, as well she reports productive cough, green phlegm over the last few days, in ED patient requiring NRB, fever 101.3, no leukocytosis, his x-ray significant for multiple opacities significant for PE versus pneumonia, known baseline anemia with hemoglobin of 7.9(was 7.4 on discharge from [facility] on 2/12), was an A. fib with heart rate in 110s, she reports worsening lower extremity edema over few days as well, received IV Lasix in ED, and I was called to admit.  Hospital Course:   Sepsis secondary to HCAP -Upon admission patient was febrile (Fever of 101.3) with tachycardia, tachypnea -Chest  x-ray showed bilateral opacities, edema versus infection -Influenza panel negative -Initially placed on vancomycin and cefepime, transitioned to Levaquin -Blood cultures and streptococcus urine antigen negative. -She received already 7 days of antibiotics, on discharge I felt she received the intended course of antibiotics. Mucinex on discharge.  Acute on chronic respiratory failure -Likely multifactorial including COPD versus pneumonia versus heart failure -Patient uses approximately 3 L of oxygen at home. Patient did require nonrebreather upon admission -Is on 23 L of oxygen per nasal cannula right now. This is resolved  Acute diastolic heart failure exacerbation -Recent echocardiogram with [facility] on 02/14/15: EF 55-60%, unable to determine diastolic function -Monitor intake and output, daily weights -UOP over past 24 hours: 1001 (net -442). Weight down 7kg since admission -Prior to admission Lasix held secondary to soft blood pressure, given some fluids. -She is back on her home medications.  Anemia of chronic disease -Hemoglobin appears to be 8.7 (baseline 7-8) -Patient was transfused 1 unit PRBC, patient denies any overt bleeding. -Hemoglobin is stable at 8.0.  Atrial fibrillation -CHADSVASC 3 (based on Age, CHF, gender) -Continue Eliquis for anticoagulation -On Cardizem CD 180 mg twice a day and 100 mg of Toprol-XL currently controlled.  Acute renal failure  -Creatinine is 1.5 on discharge, baseline of 1.1 -Renal ultrasound did not show any acute events, follow BMP as outpatient.  COPD -Currently not wheezing -Continue home medications, nebulizer treatments, steroids, Symbicort, Spiriva  Stage II pressure ulcer -Continue wound care- recommended low air mattress, silocone foam, restribution pad  Hypokalemia -This is replaced with oral supplements.   Procedures:  None  Consultations:  None  Discharge Exam: Filed Vitals:   03/21/15 2124 03/22/15 0500   BP: 89/50 94/45  Pulse: 104 107  Temp: 97.7 F (36.5 C) 97.5 F (36.4 C)  Resp: 19 18   General: Alert and awake, oriented x3, not in any acute distress. HEENT: anicteric sclera, pupils reactive to light and accommodation, EOMI CVS: S1-S2 clear, no murmur rubs or gallops Chest: clear to auscultation bilaterally, no wheezing, rales or rhonchi Abdomen: soft nontender, nondistended, normal bowel sounds, no organomegaly Extremities: no cyanosis, clubbing or edema noted bilaterally Neuro: Cranial nerves II-XII intact, no focal neurological deficits  Discharge Instructions   Discharge Instructions    Diet - low sodium heart healthy    Complete by:  As directed      Increase activity slowly    Complete by:  As directed           Current Discharge Medication List    START taking these medications   Details  furosemide (LASIX) 40 MG tablet Take 1 tablet (40 mg total) by mouth daily.      CONTINUE these medications which have CHANGED   Details  diltiazem (DILACOR XR) 180 MG 24 hr capsule Take 1 capsule (180 mg total) by mouth 2 (two) times daily.    metoprolol succinate (TOPROL-XL) 100 MG 24 hr tablet Take 1 tablet (100 mg total) by mouth daily. Take with or immediately following a meal.      CONTINUE these medications which have NOT CHANGED   Details  acetaminophen (TYLENOL) 500 MG tablet Take 500 mg by mouth every 4 (four) hours as needed for mild pain or headache.    albuterol (PROVENTIL HFA;VENTOLIN HFA) 108 (90 Base) MCG/ACT inhaler Inhale 2 puffs into the lungs every 4 (four) hours as needed for wheezing or shortness of breath.    apixaban (ELIQUIS) 5 MG TABS tablet Take 5 mg by mouth 2 (two) times daily.    atorvastatin (LIPITOR) 10 MG tablet Take 10 mg by mouth daily.    budesonide-formoterol (SYMBICORT) 160-4.5 MCG/ACT inhaler Inhale 2 puffs into the lungs 2 (two) times daily.    cetirizine (ZYRTEC) 10 MG tablet Take 10 mg by mouth as needed for allergies.     Cholecalciferol (VITAMIN D3) 5000 units TABS Take 5,000 Units by mouth daily.    docusate sodium (COLACE) 100 MG capsule Take 100 mg by mouth daily as needed for mild constipation.    guaiFENesin (MUCINEX) 600 MG 12 hr tablet Take 600 mg by mouth 2 (two) times daily.    iron polysaccharides (NIFEREX) 150 MG capsule Take 150 mg by mouth 2 (two) times daily.    levothyroxine (SYNTHROID, LEVOTHROID) 75 MCG tablet Take 75 mcg by mouth daily before breakfast.    Melatonin 5 MG TABS Take 5 mg by mouth at bedtime.    polyethylene glycol (MIRALAX / GLYCOLAX) packet Take 17 g by mouth daily.    ranitidine (ZANTAC) 300 MG tablet Take 300 mg by mouth daily.    tiotropium (SPIRIVA) 18 MCG inhalation capsule Place 18 mcg into inhaler and inhale daily.    triamcinolone (NASACORT ALLERGY 24HR) 55 MCG/ACT AERO nasal inhaler Place 2 sprays into the nose as needed (for allergies).      STOP taking these medications     torsemide (DEMADEX) 20 MG tablet        No Known Allergies    The results of significant diagnostics from this hospitalization (including imaging, microbiology, ancillary and laboratory) are listed below for reference.  Significant Diagnostic Studies: US Renal  03/20/2015  CLINICAL DATA:  Renal insufficiency EXAM: RENAL / URINARY TRACT ULTRASOUND COMPLETE COMPARISON:  None. FINDINGS: Right Kidney: Length: 9.4 cm. Echogenicity within normal limits. There is mild renal cortical thinning. No mass, perinephric fluid, or hydronephrosis visualized. No sonographically demonstrable calculus or ureterectasis. There is a relatively hypoechoic renal pyramid in the upper pole of the right kidney which appears benign. Left Kidney: Length: 9.6 cm. Echogenicity within normal limits. There is mild renal cortical thinning. No mass, perinephric fluid, or hydronephrosis visualized. There is a calculus in the upper pole of the left kidney measuring 5 mm. No ureterectasis. Bladder: Appears normal for  degree of bladder distention. IMPRESSION: Nonobstructing 5 mm calculus upper pole left kidney. Renal cortical thinning bilaterally, a finding that may be indicative of medical renal disease. The renal echogenicity bilaterally is normal, and there is no hydronephrosis on either side. Electronically Signed   By: Lowella Grip III M.D.   On: 03/20/2015 16:23   Dg Chest Port 1 View  03/19/2015  CLINICAL DATA:  Shortness of breath, history of COPD, CHF, and previous episodes of pneumonia. EXAM: PORTABLE CHEST 1 VIEW COMPARISON:  Portable chest x-ray of March 15, 2015 FINDINGS: The lungs remain well-expanded. The interstitial markings remain increased but have improved significantly. Retrocardiac density on the left likely reflects a hiatal hernia no associated atelectasis or infiltrate is not excluded. The heart is mildly enlarged though stable. The pulmonary vascularity is normal. The mediastinum is normal in width. There is gentle dextrocurvature centered in the lower thoracic spine. IMPRESSION: COPD. Improving pulmonary interstitial edema or pneumonia. Stable mild cardiomegaly without pulmonary vascular congestion. Probable hiatal hernia. Electronically Signed   By: David  Martinique M.D.   On: 03/19/2015 09:19   Dg Chest Port 1 View  03/15/2015  CLINICAL DATA:  Acute onset of worsening shortness of breath and severe generalized weakness. Initial encounter. EXAM: PORTABLE CHEST 1 VIEW COMPARISON:  Chest radiograph performed 12/06/2013 FINDINGS: The lungs are relatively well expanded. Patchy bibasilar airspace opacities may reflect pulmonary edema or pneumonia. No definite pleural effusion or pneumothorax is seen. Vascular congestion is noted. The cardiomediastinal silhouette is mildly enlarged. No acute osseous abnormalities are identified. IMPRESSION: Vascular congestion and mild cardiomegaly. Patchy bibasilar airspace opacities may reflect pulmonary edema or pneumonia. Electronically Signed   By: Garald Balding M.D.   On: 03/15/2015 07:02    Microbiology: Recent Results (from the past 240 hour(s))  Culture, blood (routine x 2) Call MD if unable to obtain prior to antibiotics being given     Status: None   Collection Time: 03/15/15  9:25 AM  Result Value Ref Range Status   Specimen Description BLOOD RIGHT FOREARM  Final   Special Requests BOTTLES DRAWN AEROBIC AND ANAEROBIC 5CCS  Final   Culture NO GROWTH 5 DAYS  Final   Report Status 03/20/2015 FINAL  Final  Culture, blood (routine x 2) Call MD if unable to obtain prior to antibiotics being given     Status: None   Collection Time: 03/15/15  9:31 AM  Result Value Ref Range Status   Specimen Description BLOOD RIGHT HAND  Final   Special Requests BOTTLES DRAWN AEROBIC ONLY 10CCS  Final   Culture NO GROWTH 5 DAYS  Final   Report Status 03/20/2015 FINAL  Final  MRSA PCR Screening     Status: None   Collection Time: 03/15/15  9:43 PM  Result Value Ref Range Status   MRSA by PCR NEGATIVE  NEGATIVE Final    Comment:        The GeneXpert MRSA Assay (FDA approved for NASAL specimens only), is one component of a comprehensive MRSA colonization surveillance program. It is not intended to diagnose MRSA infection nor to guide or monitor treatment for MRSA infections.      Labs: Basic Metabolic Panel:  Recent Labs Lab 03/18/15 0404 03/19/15 0508 03/20/15 0705 03/21/15 0653 03/22/15 0623  NA 140 140 140 140 141  K 4.2 3.3* 3.3* 4.0 4.2  CL 94* 91* 94* 98* 96*  CO2 35* 36* 36* 33* 34*  GLUCOSE 93 97 108* 92 100*  BUN 26* 24* 24* 20 19  CREATININE 1.31* 1.43* 1.42* 1.35* 1.54*  CALCIUM 8.4* 8.9 8.5* 8.4* 8.7*   Liver Function Tests: No results for input(s): AST, ALT, ALKPHOS, BILITOT, PROT, ALBUMIN in the last 168 hours. No results for input(s): LIPASE, AMYLASE in the last 168 hours. No results for input(s): AMMONIA in the last 168 hours. CBC:  Recent Labs Lab 03/18/15 0404 03/19/15 0508 03/20/15 0705 03/21/15 0653  03/22/15 0623  WBC 6.3 7.1 6.9 8.1 7.4  HGB 8.1* 8.6* 8.7* 8.1* 8.0*  HCT 27.3* 29.1* 28.1* 27.7* 26.5*  MCV 81.5 81.5 81.7 83.4 83.9  PLT 345 429* 413* 463* 460*   Cardiac Enzymes: No results for input(s): CKTOTAL, CKMB, CKMBINDEX, TROPONINI in the last 168 hours. BNP: BNP (last 3 results)  Recent Labs  03/15/15 0647  BNP 303.1*    ProBNP (last 3 results) No results for input(s): PROBNP in the last 8760 hours.  CBG: No results for input(s): GLUCAP in the last 168 hours.     Signed:  Birdie Hopes MD.  [facility] 03/22/2015, 1:54 PM

## 2015-03-24 DIAGNOSIS — L89152 Pressure ulcer of sacral region, stage 2: Secondary | ICD-10-CM | POA: Diagnosis not present

## 2015-03-24 DIAGNOSIS — I5031 Acute diastolic (congestive) heart failure: Secondary | ICD-10-CM | POA: Diagnosis not present

## 2015-03-24 DIAGNOSIS — D649 Anemia, unspecified: Secondary | ICD-10-CM | POA: Diagnosis not present

## 2015-03-24 DIAGNOSIS — Z9981 Dependence on supplemental oxygen: Secondary | ICD-10-CM | POA: Diagnosis not present

## 2015-03-24 DIAGNOSIS — I4891 Unspecified atrial fibrillation: Secondary | ICD-10-CM | POA: Diagnosis not present

## 2015-03-24 DIAGNOSIS — J449 Chronic obstructive pulmonary disease, unspecified: Secondary | ICD-10-CM | POA: Diagnosis not present

## 2015-03-24 DIAGNOSIS — J189 Pneumonia, unspecified organism: Secondary | ICD-10-CM | POA: Diagnosis not present

## 2015-03-27 DIAGNOSIS — D649 Anemia, unspecified: Secondary | ICD-10-CM | POA: Diagnosis not present

## 2015-03-27 DIAGNOSIS — I5031 Acute diastolic (congestive) heart failure: Secondary | ICD-10-CM | POA: Diagnosis not present

## 2015-03-27 DIAGNOSIS — I252 Old myocardial infarction: Secondary | ICD-10-CM | POA: Diagnosis not present

## 2015-03-27 DIAGNOSIS — I4891 Unspecified atrial fibrillation: Secondary | ICD-10-CM | POA: Diagnosis not present

## 2015-03-27 DIAGNOSIS — J439 Emphysema, unspecified: Secondary | ICD-10-CM | POA: Diagnosis not present

## 2015-03-27 DIAGNOSIS — R9431 Abnormal electrocardiogram [ECG] [EKG]: Secondary | ICD-10-CM | POA: Diagnosis not present

## 2015-03-27 DIAGNOSIS — J449 Chronic obstructive pulmonary disease, unspecified: Secondary | ICD-10-CM | POA: Diagnosis not present

## 2015-03-27 DIAGNOSIS — Z9981 Dependence on supplemental oxygen: Secondary | ICD-10-CM | POA: Diagnosis not present

## 2015-03-27 DIAGNOSIS — L89152 Pressure ulcer of sacral region, stage 2: Secondary | ICD-10-CM | POA: Diagnosis not present

## 2015-03-27 DIAGNOSIS — J189 Pneumonia, unspecified organism: Secondary | ICD-10-CM | POA: Diagnosis not present

## 2015-03-27 DIAGNOSIS — I10 Essential (primary) hypertension: Secondary | ICD-10-CM | POA: Diagnosis not present

## 2015-03-27 DIAGNOSIS — I5032 Chronic diastolic (congestive) heart failure: Secondary | ICD-10-CM | POA: Diagnosis not present

## 2015-03-29 DIAGNOSIS — D649 Anemia, unspecified: Secondary | ICD-10-CM | POA: Diagnosis not present

## 2015-03-29 DIAGNOSIS — J189 Pneumonia, unspecified organism: Secondary | ICD-10-CM | POA: Diagnosis not present

## 2015-03-29 DIAGNOSIS — Z09 Encounter for follow-up examination after completed treatment for conditions other than malignant neoplasm: Secondary | ICD-10-CM | POA: Diagnosis not present

## 2015-03-29 DIAGNOSIS — R030 Elevated blood-pressure reading, without diagnosis of hypertension: Secondary | ICD-10-CM | POA: Diagnosis not present

## 2015-03-29 DIAGNOSIS — I5031 Acute diastolic (congestive) heart failure: Secondary | ICD-10-CM | POA: Diagnosis not present

## 2015-03-29 DIAGNOSIS — J449 Chronic obstructive pulmonary disease, unspecified: Secondary | ICD-10-CM | POA: Diagnosis not present

## 2015-03-29 DIAGNOSIS — L89302 Pressure ulcer of unspecified buttock, stage 2: Secondary | ICD-10-CM | POA: Diagnosis not present

## 2015-03-29 DIAGNOSIS — Z9981 Dependence on supplemental oxygen: Secondary | ICD-10-CM | POA: Diagnosis not present

## 2015-03-29 DIAGNOSIS — L719 Rosacea, unspecified: Secondary | ICD-10-CM | POA: Diagnosis not present

## 2015-03-29 DIAGNOSIS — L89312 Pressure ulcer of right buttock, stage 2: Secondary | ICD-10-CM | POA: Diagnosis not present

## 2015-03-29 DIAGNOSIS — I4891 Unspecified atrial fibrillation: Secondary | ICD-10-CM | POA: Diagnosis not present

## 2015-03-30 DIAGNOSIS — I5031 Acute diastolic (congestive) heart failure: Secondary | ICD-10-CM | POA: Diagnosis not present

## 2015-03-30 DIAGNOSIS — J189 Pneumonia, unspecified organism: Secondary | ICD-10-CM | POA: Diagnosis not present

## 2015-03-30 DIAGNOSIS — D649 Anemia, unspecified: Secondary | ICD-10-CM | POA: Diagnosis not present

## 2015-03-30 DIAGNOSIS — I4891 Unspecified atrial fibrillation: Secondary | ICD-10-CM | POA: Diagnosis not present

## 2015-03-30 DIAGNOSIS — J449 Chronic obstructive pulmonary disease, unspecified: Secondary | ICD-10-CM | POA: Diagnosis not present

## 2015-03-30 DIAGNOSIS — L89312 Pressure ulcer of right buttock, stage 2: Secondary | ICD-10-CM | POA: Diagnosis not present

## 2015-03-30 DIAGNOSIS — Z9981 Dependence on supplemental oxygen: Secondary | ICD-10-CM | POA: Diagnosis not present

## 2015-04-02 DIAGNOSIS — I4891 Unspecified atrial fibrillation: Secondary | ICD-10-CM | POA: Diagnosis not present

## 2015-04-02 DIAGNOSIS — I5031 Acute diastolic (congestive) heart failure: Secondary | ICD-10-CM | POA: Diagnosis not present

## 2015-04-02 DIAGNOSIS — J189 Pneumonia, unspecified organism: Secondary | ICD-10-CM | POA: Diagnosis not present

## 2015-04-02 DIAGNOSIS — Z9981 Dependence on supplemental oxygen: Secondary | ICD-10-CM | POA: Diagnosis not present

## 2015-04-02 DIAGNOSIS — D649 Anemia, unspecified: Secondary | ICD-10-CM | POA: Diagnosis not present

## 2015-04-02 DIAGNOSIS — J449 Chronic obstructive pulmonary disease, unspecified: Secondary | ICD-10-CM | POA: Diagnosis not present

## 2015-04-02 DIAGNOSIS — L89312 Pressure ulcer of right buttock, stage 2: Secondary | ICD-10-CM | POA: Diagnosis not present

## 2015-04-03 DIAGNOSIS — J90 Pleural effusion, not elsewhere classified: Secondary | ICD-10-CM | POA: Diagnosis not present

## 2015-04-03 DIAGNOSIS — I5031 Acute diastolic (congestive) heart failure: Secondary | ICD-10-CM | POA: Diagnosis not present

## 2015-04-03 DIAGNOSIS — R918 Other nonspecific abnormal finding of lung field: Secondary | ICD-10-CM | POA: Diagnosis not present

## 2015-04-03 DIAGNOSIS — J189 Pneumonia, unspecified organism: Secondary | ICD-10-CM | POA: Diagnosis not present

## 2015-04-03 DIAGNOSIS — J449 Chronic obstructive pulmonary disease, unspecified: Secondary | ICD-10-CM | POA: Diagnosis not present

## 2015-04-03 DIAGNOSIS — Z9981 Dependence on supplemental oxygen: Secondary | ICD-10-CM | POA: Diagnosis not present

## 2015-04-03 DIAGNOSIS — I4891 Unspecified atrial fibrillation: Secondary | ICD-10-CM | POA: Diagnosis not present

## 2015-04-03 DIAGNOSIS — D649 Anemia, unspecified: Secondary | ICD-10-CM | POA: Diagnosis not present

## 2015-04-03 DIAGNOSIS — R911 Solitary pulmonary nodule: Secondary | ICD-10-CM | POA: Diagnosis not present

## 2015-04-03 DIAGNOSIS — L89312 Pressure ulcer of right buttock, stage 2: Secondary | ICD-10-CM | POA: Diagnosis not present

## 2015-04-04 DIAGNOSIS — A419 Sepsis, unspecified organism: Secondary | ICD-10-CM | POA: Diagnosis not present

## 2015-04-04 DIAGNOSIS — Z9981 Dependence on supplemental oxygen: Secondary | ICD-10-CM | POA: Diagnosis not present

## 2015-04-04 DIAGNOSIS — I4891 Unspecified atrial fibrillation: Secondary | ICD-10-CM | POA: Diagnosis not present

## 2015-04-04 DIAGNOSIS — L89312 Pressure ulcer of right buttock, stage 2: Secondary | ICD-10-CM | POA: Diagnosis not present

## 2015-04-04 DIAGNOSIS — I5033 Acute on chronic diastolic (congestive) heart failure: Secondary | ICD-10-CM | POA: Diagnosis not present

## 2015-04-04 DIAGNOSIS — J9610 Chronic respiratory failure, unspecified whether with hypoxia or hypercapnia: Secondary | ICD-10-CM | POA: Diagnosis not present

## 2015-04-04 DIAGNOSIS — I5031 Acute diastolic (congestive) heart failure: Secondary | ICD-10-CM | POA: Diagnosis not present

## 2015-04-04 DIAGNOSIS — D649 Anemia, unspecified: Secondary | ICD-10-CM | POA: Diagnosis not present

## 2015-04-04 DIAGNOSIS — L89152 Pressure ulcer of sacral region, stage 2: Secondary | ICD-10-CM | POA: Diagnosis not present

## 2015-04-04 DIAGNOSIS — J449 Chronic obstructive pulmonary disease, unspecified: Secondary | ICD-10-CM | POA: Diagnosis not present

## 2015-04-04 DIAGNOSIS — R269 Unspecified abnormalities of gait and mobility: Secondary | ICD-10-CM | POA: Diagnosis not present

## 2015-04-04 DIAGNOSIS — J189 Pneumonia, unspecified organism: Secondary | ICD-10-CM | POA: Diagnosis not present

## 2015-04-05 DIAGNOSIS — J189 Pneumonia, unspecified organism: Secondary | ICD-10-CM | POA: Diagnosis not present

## 2015-04-05 DIAGNOSIS — I5031 Acute diastolic (congestive) heart failure: Secondary | ICD-10-CM | POA: Diagnosis not present

## 2015-04-05 DIAGNOSIS — Z9981 Dependence on supplemental oxygen: Secondary | ICD-10-CM | POA: Diagnosis not present

## 2015-04-05 DIAGNOSIS — J449 Chronic obstructive pulmonary disease, unspecified: Secondary | ICD-10-CM | POA: Diagnosis not present

## 2015-04-05 DIAGNOSIS — D649 Anemia, unspecified: Secondary | ICD-10-CM | POA: Diagnosis not present

## 2015-04-05 DIAGNOSIS — I4891 Unspecified atrial fibrillation: Secondary | ICD-10-CM | POA: Diagnosis not present

## 2015-04-05 DIAGNOSIS — L89312 Pressure ulcer of right buttock, stage 2: Secondary | ICD-10-CM | POA: Diagnosis not present

## 2015-04-09 DIAGNOSIS — I509 Heart failure, unspecified: Secondary | ICD-10-CM | POA: Diagnosis not present

## 2015-04-09 DIAGNOSIS — R918 Other nonspecific abnormal finding of lung field: Secondary | ICD-10-CM | POA: Diagnosis not present

## 2015-04-09 DIAGNOSIS — J449 Chronic obstructive pulmonary disease, unspecified: Secondary | ICD-10-CM | POA: Diagnosis not present

## 2015-04-09 DIAGNOSIS — G4733 Obstructive sleep apnea (adult) (pediatric): Secondary | ICD-10-CM | POA: Diagnosis not present

## 2015-04-10 DIAGNOSIS — I4891 Unspecified atrial fibrillation: Secondary | ICD-10-CM | POA: Diagnosis not present

## 2015-04-10 DIAGNOSIS — D649 Anemia, unspecified: Secondary | ICD-10-CM | POA: Diagnosis not present

## 2015-04-10 DIAGNOSIS — L89312 Pressure ulcer of right buttock, stage 2: Secondary | ICD-10-CM | POA: Diagnosis not present

## 2015-04-10 DIAGNOSIS — Z9981 Dependence on supplemental oxygen: Secondary | ICD-10-CM | POA: Diagnosis not present

## 2015-04-10 DIAGNOSIS — I5031 Acute diastolic (congestive) heart failure: Secondary | ICD-10-CM | POA: Diagnosis not present

## 2015-04-10 DIAGNOSIS — J189 Pneumonia, unspecified organism: Secondary | ICD-10-CM | POA: Diagnosis not present

## 2015-04-10 DIAGNOSIS — J449 Chronic obstructive pulmonary disease, unspecified: Secondary | ICD-10-CM | POA: Diagnosis not present

## 2015-04-12 DIAGNOSIS — I5031 Acute diastolic (congestive) heart failure: Secondary | ICD-10-CM | POA: Diagnosis not present

## 2015-04-12 DIAGNOSIS — D649 Anemia, unspecified: Secondary | ICD-10-CM | POA: Diagnosis not present

## 2015-04-12 DIAGNOSIS — J449 Chronic obstructive pulmonary disease, unspecified: Secondary | ICD-10-CM | POA: Diagnosis not present

## 2015-04-12 DIAGNOSIS — J189 Pneumonia, unspecified organism: Secondary | ICD-10-CM | POA: Diagnosis not present

## 2015-04-12 DIAGNOSIS — L89312 Pressure ulcer of right buttock, stage 2: Secondary | ICD-10-CM | POA: Diagnosis not present

## 2015-04-12 DIAGNOSIS — I4891 Unspecified atrial fibrillation: Secondary | ICD-10-CM | POA: Diagnosis not present

## 2015-04-12 DIAGNOSIS — Z9981 Dependence on supplemental oxygen: Secondary | ICD-10-CM | POA: Diagnosis not present

## 2015-04-13 DIAGNOSIS — J449 Chronic obstructive pulmonary disease, unspecified: Secondary | ICD-10-CM | POA: Diagnosis not present

## 2015-04-13 DIAGNOSIS — J189 Pneumonia, unspecified organism: Secondary | ICD-10-CM | POA: Diagnosis not present

## 2015-04-13 DIAGNOSIS — I4891 Unspecified atrial fibrillation: Secondary | ICD-10-CM | POA: Diagnosis not present

## 2015-04-13 DIAGNOSIS — Z9981 Dependence on supplemental oxygen: Secondary | ICD-10-CM | POA: Diagnosis not present

## 2015-04-13 DIAGNOSIS — L89312 Pressure ulcer of right buttock, stage 2: Secondary | ICD-10-CM | POA: Diagnosis not present

## 2015-04-13 DIAGNOSIS — I5031 Acute diastolic (congestive) heart failure: Secondary | ICD-10-CM | POA: Diagnosis not present

## 2015-04-13 DIAGNOSIS — D649 Anemia, unspecified: Secondary | ICD-10-CM | POA: Diagnosis not present

## 2015-04-16 DIAGNOSIS — L89312 Pressure ulcer of right buttock, stage 2: Secondary | ICD-10-CM | POA: Diagnosis not present

## 2015-04-16 DIAGNOSIS — Z9981 Dependence on supplemental oxygen: Secondary | ICD-10-CM | POA: Diagnosis not present

## 2015-04-16 DIAGNOSIS — J449 Chronic obstructive pulmonary disease, unspecified: Secondary | ICD-10-CM | POA: Diagnosis not present

## 2015-04-16 DIAGNOSIS — I4891 Unspecified atrial fibrillation: Secondary | ICD-10-CM | POA: Diagnosis not present

## 2015-04-16 DIAGNOSIS — D649 Anemia, unspecified: Secondary | ICD-10-CM | POA: Diagnosis not present

## 2015-04-16 DIAGNOSIS — J189 Pneumonia, unspecified organism: Secondary | ICD-10-CM | POA: Diagnosis not present

## 2015-04-16 DIAGNOSIS — I5031 Acute diastolic (congestive) heart failure: Secondary | ICD-10-CM | POA: Diagnosis not present

## 2015-04-17 DIAGNOSIS — Z9981 Dependence on supplemental oxygen: Secondary | ICD-10-CM | POA: Diagnosis not present

## 2015-04-17 DIAGNOSIS — L89312 Pressure ulcer of right buttock, stage 2: Secondary | ICD-10-CM | POA: Diagnosis not present

## 2015-04-17 DIAGNOSIS — J189 Pneumonia, unspecified organism: Secondary | ICD-10-CM | POA: Diagnosis not present

## 2015-04-17 DIAGNOSIS — I4891 Unspecified atrial fibrillation: Secondary | ICD-10-CM | POA: Diagnosis not present

## 2015-04-17 DIAGNOSIS — D649 Anemia, unspecified: Secondary | ICD-10-CM | POA: Diagnosis not present

## 2015-04-17 DIAGNOSIS — I5033 Acute on chronic diastolic (congestive) heart failure: Secondary | ICD-10-CM | POA: Diagnosis not present

## 2015-04-17 DIAGNOSIS — J449 Chronic obstructive pulmonary disease, unspecified: Secondary | ICD-10-CM | POA: Diagnosis not present

## 2015-04-17 DIAGNOSIS — I5031 Acute diastolic (congestive) heart failure: Secondary | ICD-10-CM | POA: Diagnosis not present

## 2015-04-18 DIAGNOSIS — Z9981 Dependence on supplemental oxygen: Secondary | ICD-10-CM | POA: Diagnosis not present

## 2015-04-18 DIAGNOSIS — L89312 Pressure ulcer of right buttock, stage 2: Secondary | ICD-10-CM | POA: Diagnosis not present

## 2015-04-18 DIAGNOSIS — J449 Chronic obstructive pulmonary disease, unspecified: Secondary | ICD-10-CM | POA: Diagnosis not present

## 2015-04-18 DIAGNOSIS — D649 Anemia, unspecified: Secondary | ICD-10-CM | POA: Diagnosis not present

## 2015-04-18 DIAGNOSIS — I5031 Acute diastolic (congestive) heart failure: Secondary | ICD-10-CM | POA: Diagnosis not present

## 2015-04-18 DIAGNOSIS — I4891 Unspecified atrial fibrillation: Secondary | ICD-10-CM | POA: Diagnosis not present

## 2015-04-18 DIAGNOSIS — J189 Pneumonia, unspecified organism: Secondary | ICD-10-CM | POA: Diagnosis not present

## 2015-04-19 DIAGNOSIS — D649 Anemia, unspecified: Secondary | ICD-10-CM | POA: Diagnosis not present

## 2015-04-19 DIAGNOSIS — Z9981 Dependence on supplemental oxygen: Secondary | ICD-10-CM | POA: Diagnosis not present

## 2015-04-19 DIAGNOSIS — J449 Chronic obstructive pulmonary disease, unspecified: Secondary | ICD-10-CM | POA: Diagnosis not present

## 2015-04-19 DIAGNOSIS — L89312 Pressure ulcer of right buttock, stage 2: Secondary | ICD-10-CM | POA: Diagnosis not present

## 2015-04-19 DIAGNOSIS — J189 Pneumonia, unspecified organism: Secondary | ICD-10-CM | POA: Diagnosis not present

## 2015-04-19 DIAGNOSIS — I4891 Unspecified atrial fibrillation: Secondary | ICD-10-CM | POA: Diagnosis not present

## 2015-04-19 DIAGNOSIS — I5031 Acute diastolic (congestive) heart failure: Secondary | ICD-10-CM | POA: Diagnosis not present

## 2015-04-23 DIAGNOSIS — D649 Anemia, unspecified: Secondary | ICD-10-CM | POA: Diagnosis not present

## 2015-04-23 DIAGNOSIS — J189 Pneumonia, unspecified organism: Secondary | ICD-10-CM | POA: Diagnosis not present

## 2015-04-23 DIAGNOSIS — L89312 Pressure ulcer of right buttock, stage 2: Secondary | ICD-10-CM | POA: Diagnosis not present

## 2015-04-23 DIAGNOSIS — Z9981 Dependence on supplemental oxygen: Secondary | ICD-10-CM | POA: Diagnosis not present

## 2015-04-23 DIAGNOSIS — I4891 Unspecified atrial fibrillation: Secondary | ICD-10-CM | POA: Diagnosis not present

## 2015-04-23 DIAGNOSIS — J449 Chronic obstructive pulmonary disease, unspecified: Secondary | ICD-10-CM | POA: Diagnosis not present

## 2015-04-23 DIAGNOSIS — I5031 Acute diastolic (congestive) heart failure: Secondary | ICD-10-CM | POA: Diagnosis not present

## 2015-04-24 DIAGNOSIS — I5032 Chronic diastolic (congestive) heart failure: Secondary | ICD-10-CM | POA: Diagnosis not present

## 2015-04-24 DIAGNOSIS — I110 Hypertensive heart disease with heart failure: Secondary | ICD-10-CM | POA: Diagnosis not present

## 2015-04-24 DIAGNOSIS — I4891 Unspecified atrial fibrillation: Secondary | ICD-10-CM | POA: Diagnosis not present

## 2015-04-24 DIAGNOSIS — R9431 Abnormal electrocardiogram [ECG] [EKG]: Secondary | ICD-10-CM | POA: Diagnosis not present

## 2015-04-24 DIAGNOSIS — J439 Emphysema, unspecified: Secondary | ICD-10-CM | POA: Diagnosis not present

## 2015-04-25 DIAGNOSIS — D500 Iron deficiency anemia secondary to blood loss (chronic): Secondary | ICD-10-CM | POA: Diagnosis not present

## 2015-04-25 DIAGNOSIS — J189 Pneumonia, unspecified organism: Secondary | ICD-10-CM | POA: Diagnosis not present

## 2015-04-25 DIAGNOSIS — D649 Anemia, unspecified: Secondary | ICD-10-CM | POA: Diagnosis not present

## 2015-04-25 DIAGNOSIS — D72829 Elevated white blood cell count, unspecified: Secondary | ICD-10-CM | POA: Diagnosis not present

## 2015-04-25 DIAGNOSIS — D473 Essential (hemorrhagic) thrombocythemia: Secondary | ICD-10-CM | POA: Diagnosis not present

## 2015-04-25 DIAGNOSIS — J449 Chronic obstructive pulmonary disease, unspecified: Secondary | ICD-10-CM | POA: Diagnosis not present

## 2015-04-25 DIAGNOSIS — Z9981 Dependence on supplemental oxygen: Secondary | ICD-10-CM | POA: Diagnosis not present

## 2015-04-25 DIAGNOSIS — L89312 Pressure ulcer of right buttock, stage 2: Secondary | ICD-10-CM | POA: Diagnosis not present

## 2015-04-25 DIAGNOSIS — D509 Iron deficiency anemia, unspecified: Secondary | ICD-10-CM | POA: Diagnosis not present

## 2015-04-25 DIAGNOSIS — I5031 Acute diastolic (congestive) heart failure: Secondary | ICD-10-CM | POA: Diagnosis not present

## 2015-04-25 DIAGNOSIS — I4891 Unspecified atrial fibrillation: Secondary | ICD-10-CM | POA: Diagnosis not present

## 2015-04-26 DIAGNOSIS — L89312 Pressure ulcer of right buttock, stage 2: Secondary | ICD-10-CM | POA: Diagnosis not present

## 2015-04-26 DIAGNOSIS — D649 Anemia, unspecified: Secondary | ICD-10-CM | POA: Diagnosis not present

## 2015-04-26 DIAGNOSIS — I4891 Unspecified atrial fibrillation: Secondary | ICD-10-CM | POA: Diagnosis not present

## 2015-04-26 DIAGNOSIS — I5031 Acute diastolic (congestive) heart failure: Secondary | ICD-10-CM | POA: Diagnosis not present

## 2015-04-26 DIAGNOSIS — J189 Pneumonia, unspecified organism: Secondary | ICD-10-CM | POA: Diagnosis not present

## 2015-04-26 DIAGNOSIS — Z9981 Dependence on supplemental oxygen: Secondary | ICD-10-CM | POA: Diagnosis not present

## 2015-04-26 DIAGNOSIS — J449 Chronic obstructive pulmonary disease, unspecified: Secondary | ICD-10-CM | POA: Diagnosis not present

## 2015-04-27 DIAGNOSIS — I509 Heart failure, unspecified: Secondary | ICD-10-CM | POA: Diagnosis not present

## 2015-04-27 DIAGNOSIS — J90 Pleural effusion, not elsewhere classified: Secondary | ICD-10-CM | POA: Diagnosis not present

## 2015-04-27 DIAGNOSIS — J449 Chronic obstructive pulmonary disease, unspecified: Secondary | ICD-10-CM | POA: Diagnosis not present

## 2015-04-27 DIAGNOSIS — G4733 Obstructive sleep apnea (adult) (pediatric): Secondary | ICD-10-CM | POA: Diagnosis not present

## 2015-04-27 DIAGNOSIS — R0902 Hypoxemia: Secondary | ICD-10-CM | POA: Diagnosis not present

## 2015-04-30 DIAGNOSIS — L89152 Pressure ulcer of sacral region, stage 2: Secondary | ICD-10-CM | POA: Diagnosis not present

## 2015-04-30 DIAGNOSIS — L89312 Pressure ulcer of right buttock, stage 2: Secondary | ICD-10-CM | POA: Diagnosis not present

## 2015-04-30 DIAGNOSIS — Z9981 Dependence on supplemental oxygen: Secondary | ICD-10-CM | POA: Diagnosis not present

## 2015-04-30 DIAGNOSIS — J189 Pneumonia, unspecified organism: Secondary | ICD-10-CM | POA: Diagnosis not present

## 2015-04-30 DIAGNOSIS — I5031 Acute diastolic (congestive) heart failure: Secondary | ICD-10-CM | POA: Diagnosis not present

## 2015-04-30 DIAGNOSIS — D649 Anemia, unspecified: Secondary | ICD-10-CM | POA: Diagnosis not present

## 2015-04-30 DIAGNOSIS — I4891 Unspecified atrial fibrillation: Secondary | ICD-10-CM | POA: Diagnosis not present

## 2015-04-30 DIAGNOSIS — J449 Chronic obstructive pulmonary disease, unspecified: Secondary | ICD-10-CM | POA: Diagnosis not present

## 2015-05-01 DIAGNOSIS — D500 Iron deficiency anemia secondary to blood loss (chronic): Secondary | ICD-10-CM | POA: Diagnosis not present

## 2015-05-01 DIAGNOSIS — D72829 Elevated white blood cell count, unspecified: Secondary | ICD-10-CM | POA: Diagnosis not present

## 2015-05-02 DIAGNOSIS — J189 Pneumonia, unspecified organism: Secondary | ICD-10-CM | POA: Diagnosis not present

## 2015-05-02 DIAGNOSIS — I5031 Acute diastolic (congestive) heart failure: Secondary | ICD-10-CM | POA: Diagnosis not present

## 2015-05-02 DIAGNOSIS — Z9981 Dependence on supplemental oxygen: Secondary | ICD-10-CM | POA: Diagnosis not present

## 2015-05-02 DIAGNOSIS — I4891 Unspecified atrial fibrillation: Secondary | ICD-10-CM | POA: Diagnosis not present

## 2015-05-02 DIAGNOSIS — D649 Anemia, unspecified: Secondary | ICD-10-CM | POA: Diagnosis not present

## 2015-05-02 DIAGNOSIS — L89312 Pressure ulcer of right buttock, stage 2: Secondary | ICD-10-CM | POA: Diagnosis not present

## 2015-05-02 DIAGNOSIS — J449 Chronic obstructive pulmonary disease, unspecified: Secondary | ICD-10-CM | POA: Diagnosis not present

## 2015-05-03 DIAGNOSIS — R0902 Hypoxemia: Secondary | ICD-10-CM | POA: Diagnosis not present

## 2015-05-03 DIAGNOSIS — J90 Pleural effusion, not elsewhere classified: Secondary | ICD-10-CM | POA: Diagnosis not present

## 2015-05-03 DIAGNOSIS — K219 Gastro-esophageal reflux disease without esophagitis: Secondary | ICD-10-CM | POA: Diagnosis not present

## 2015-05-03 DIAGNOSIS — J449 Chronic obstructive pulmonary disease, unspecified: Secondary | ICD-10-CM | POA: Diagnosis not present

## 2015-05-04 DIAGNOSIS — I5031 Acute diastolic (congestive) heart failure: Secondary | ICD-10-CM | POA: Diagnosis not present

## 2015-05-04 DIAGNOSIS — L89312 Pressure ulcer of right buttock, stage 2: Secondary | ICD-10-CM | POA: Diagnosis not present

## 2015-05-04 DIAGNOSIS — D649 Anemia, unspecified: Secondary | ICD-10-CM | POA: Diagnosis not present

## 2015-05-04 DIAGNOSIS — J449 Chronic obstructive pulmonary disease, unspecified: Secondary | ICD-10-CM | POA: Diagnosis not present

## 2015-05-04 DIAGNOSIS — Z9981 Dependence on supplemental oxygen: Secondary | ICD-10-CM | POA: Diagnosis not present

## 2015-05-04 DIAGNOSIS — J189 Pneumonia, unspecified organism: Secondary | ICD-10-CM | POA: Diagnosis not present

## 2015-05-04 DIAGNOSIS — I4891 Unspecified atrial fibrillation: Secondary | ICD-10-CM | POA: Diagnosis not present

## 2015-05-07 DIAGNOSIS — J449 Chronic obstructive pulmonary disease, unspecified: Secondary | ICD-10-CM | POA: Diagnosis not present

## 2015-05-07 DIAGNOSIS — I4891 Unspecified atrial fibrillation: Secondary | ICD-10-CM | POA: Diagnosis not present

## 2015-05-07 DIAGNOSIS — I5031 Acute diastolic (congestive) heart failure: Secondary | ICD-10-CM | POA: Diagnosis not present

## 2015-05-07 DIAGNOSIS — D649 Anemia, unspecified: Secondary | ICD-10-CM | POA: Diagnosis not present

## 2015-05-07 DIAGNOSIS — J189 Pneumonia, unspecified organism: Secondary | ICD-10-CM | POA: Diagnosis not present

## 2015-05-07 DIAGNOSIS — L89312 Pressure ulcer of right buttock, stage 2: Secondary | ICD-10-CM | POA: Diagnosis not present

## 2015-05-07 DIAGNOSIS — Z9981 Dependence on supplemental oxygen: Secondary | ICD-10-CM | POA: Diagnosis not present

## 2015-05-08 DIAGNOSIS — D500 Iron deficiency anemia secondary to blood loss (chronic): Secondary | ICD-10-CM | POA: Diagnosis not present

## 2015-05-08 DIAGNOSIS — D72829 Elevated white blood cell count, unspecified: Secondary | ICD-10-CM | POA: Diagnosis not present

## 2015-05-09 DIAGNOSIS — I5031 Acute diastolic (congestive) heart failure: Secondary | ICD-10-CM | POA: Diagnosis not present

## 2015-05-09 DIAGNOSIS — Z9981 Dependence on supplemental oxygen: Secondary | ICD-10-CM | POA: Diagnosis not present

## 2015-05-09 DIAGNOSIS — D649 Anemia, unspecified: Secondary | ICD-10-CM | POA: Diagnosis not present

## 2015-05-09 DIAGNOSIS — J189 Pneumonia, unspecified organism: Secondary | ICD-10-CM | POA: Diagnosis not present

## 2015-05-09 DIAGNOSIS — L89312 Pressure ulcer of right buttock, stage 2: Secondary | ICD-10-CM | POA: Diagnosis not present

## 2015-05-09 DIAGNOSIS — I4891 Unspecified atrial fibrillation: Secondary | ICD-10-CM | POA: Diagnosis not present

## 2015-05-09 DIAGNOSIS — J449 Chronic obstructive pulmonary disease, unspecified: Secondary | ICD-10-CM | POA: Diagnosis not present

## 2015-05-11 DIAGNOSIS — I4891 Unspecified atrial fibrillation: Secondary | ICD-10-CM | POA: Diagnosis not present

## 2015-05-11 DIAGNOSIS — E878 Other disorders of electrolyte and fluid balance, not elsewhere classified: Secondary | ICD-10-CM | POA: Diagnosis not present

## 2015-05-11 DIAGNOSIS — J449 Chronic obstructive pulmonary disease, unspecified: Secondary | ICD-10-CM | POA: Diagnosis not present

## 2015-05-11 DIAGNOSIS — I5031 Acute diastolic (congestive) heart failure: Secondary | ICD-10-CM | POA: Diagnosis not present

## 2015-05-11 DIAGNOSIS — Z9981 Dependence on supplemental oxygen: Secondary | ICD-10-CM | POA: Diagnosis not present

## 2015-05-11 DIAGNOSIS — D649 Anemia, unspecified: Secondary | ICD-10-CM | POA: Diagnosis not present

## 2015-05-11 DIAGNOSIS — L89312 Pressure ulcer of right buttock, stage 2: Secondary | ICD-10-CM | POA: Diagnosis not present

## 2015-05-11 DIAGNOSIS — J189 Pneumonia, unspecified organism: Secondary | ICD-10-CM | POA: Diagnosis not present

## 2015-05-14 DIAGNOSIS — J189 Pneumonia, unspecified organism: Secondary | ICD-10-CM | POA: Diagnosis not present

## 2015-05-14 DIAGNOSIS — I5031 Acute diastolic (congestive) heart failure: Secondary | ICD-10-CM | POA: Diagnosis not present

## 2015-05-14 DIAGNOSIS — I4891 Unspecified atrial fibrillation: Secondary | ICD-10-CM | POA: Diagnosis not present

## 2015-05-14 DIAGNOSIS — D649 Anemia, unspecified: Secondary | ICD-10-CM | POA: Diagnosis not present

## 2015-05-14 DIAGNOSIS — J449 Chronic obstructive pulmonary disease, unspecified: Secondary | ICD-10-CM | POA: Diagnosis not present

## 2015-05-14 DIAGNOSIS — Z9981 Dependence on supplemental oxygen: Secondary | ICD-10-CM | POA: Diagnosis not present

## 2015-05-14 DIAGNOSIS — L89312 Pressure ulcer of right buttock, stage 2: Secondary | ICD-10-CM | POA: Diagnosis not present

## 2015-05-15 DIAGNOSIS — N183 Chronic kidney disease, stage 3 (moderate): Secondary | ICD-10-CM | POA: Diagnosis not present

## 2015-05-15 DIAGNOSIS — I10 Essential (primary) hypertension: Secondary | ICD-10-CM | POA: Diagnosis not present

## 2015-05-15 DIAGNOSIS — I480 Paroxysmal atrial fibrillation: Secondary | ICD-10-CM | POA: Diagnosis not present

## 2015-05-15 DIAGNOSIS — J42 Unspecified chronic bronchitis: Secondary | ICD-10-CM | POA: Diagnosis not present

## 2015-05-15 DIAGNOSIS — I5032 Chronic diastolic (congestive) heart failure: Secondary | ICD-10-CM | POA: Diagnosis not present

## 2015-05-16 DIAGNOSIS — J189 Pneumonia, unspecified organism: Secondary | ICD-10-CM | POA: Diagnosis not present

## 2015-05-16 DIAGNOSIS — I5031 Acute diastolic (congestive) heart failure: Secondary | ICD-10-CM | POA: Diagnosis not present

## 2015-05-16 DIAGNOSIS — I4891 Unspecified atrial fibrillation: Secondary | ICD-10-CM | POA: Diagnosis not present

## 2015-05-16 DIAGNOSIS — L89312 Pressure ulcer of right buttock, stage 2: Secondary | ICD-10-CM | POA: Diagnosis not present

## 2015-05-16 DIAGNOSIS — Z9981 Dependence on supplemental oxygen: Secondary | ICD-10-CM | POA: Diagnosis not present

## 2015-05-16 DIAGNOSIS — D649 Anemia, unspecified: Secondary | ICD-10-CM | POA: Diagnosis not present

## 2015-05-16 DIAGNOSIS — J449 Chronic obstructive pulmonary disease, unspecified: Secondary | ICD-10-CM | POA: Diagnosis not present

## 2015-05-18 DIAGNOSIS — I5033 Acute on chronic diastolic (congestive) heart failure: Secondary | ICD-10-CM | POA: Diagnosis not present

## 2015-05-21 DIAGNOSIS — I4891 Unspecified atrial fibrillation: Secondary | ICD-10-CM | POA: Diagnosis not present

## 2015-05-21 DIAGNOSIS — L89312 Pressure ulcer of right buttock, stage 2: Secondary | ICD-10-CM | POA: Diagnosis not present

## 2015-05-21 DIAGNOSIS — J449 Chronic obstructive pulmonary disease, unspecified: Secondary | ICD-10-CM | POA: Diagnosis not present

## 2015-05-21 DIAGNOSIS — J189 Pneumonia, unspecified organism: Secondary | ICD-10-CM | POA: Diagnosis not present

## 2015-05-21 DIAGNOSIS — Z9981 Dependence on supplemental oxygen: Secondary | ICD-10-CM | POA: Diagnosis not present

## 2015-05-21 DIAGNOSIS — D649 Anemia, unspecified: Secondary | ICD-10-CM | POA: Diagnosis not present

## 2015-05-21 DIAGNOSIS — I5031 Acute diastolic (congestive) heart failure: Secondary | ICD-10-CM | POA: Diagnosis not present

## 2015-05-23 DIAGNOSIS — Z9981 Dependence on supplemental oxygen: Secondary | ICD-10-CM | POA: Diagnosis not present

## 2015-05-23 DIAGNOSIS — J449 Chronic obstructive pulmonary disease, unspecified: Secondary | ICD-10-CM | POA: Diagnosis not present

## 2015-05-23 DIAGNOSIS — I4891 Unspecified atrial fibrillation: Secondary | ICD-10-CM | POA: Diagnosis not present

## 2015-05-23 DIAGNOSIS — J189 Pneumonia, unspecified organism: Secondary | ICD-10-CM | POA: Diagnosis not present

## 2015-05-23 DIAGNOSIS — I5031 Acute diastolic (congestive) heart failure: Secondary | ICD-10-CM | POA: Diagnosis not present

## 2015-05-23 DIAGNOSIS — D649 Anemia, unspecified: Secondary | ICD-10-CM | POA: Diagnosis not present

## 2015-05-23 DIAGNOSIS — L89312 Pressure ulcer of right buttock, stage 2: Secondary | ICD-10-CM | POA: Diagnosis not present

## 2015-05-24 DIAGNOSIS — N183 Chronic kidney disease, stage 3 (moderate): Secondary | ICD-10-CM | POA: Diagnosis not present

## 2015-05-24 DIAGNOSIS — E7800 Pure hypercholesterolemia, unspecified: Secondary | ICD-10-CM | POA: Diagnosis not present

## 2015-05-24 DIAGNOSIS — I4891 Unspecified atrial fibrillation: Secondary | ICD-10-CM | POA: Diagnosis not present

## 2015-05-24 DIAGNOSIS — E039 Hypothyroidism, unspecified: Secondary | ICD-10-CM | POA: Diagnosis not present

## 2015-05-24 DIAGNOSIS — I10 Essential (primary) hypertension: Secondary | ICD-10-CM | POA: Diagnosis not present

## 2015-05-25 DIAGNOSIS — D509 Iron deficiency anemia, unspecified: Secondary | ICD-10-CM | POA: Diagnosis not present

## 2015-05-25 DIAGNOSIS — D649 Anemia, unspecified: Secondary | ICD-10-CM | POA: Diagnosis not present

## 2015-05-25 DIAGNOSIS — R0602 Shortness of breath: Secondary | ICD-10-CM | POA: Diagnosis not present

## 2015-05-25 DIAGNOSIS — D72829 Elevated white blood cell count, unspecified: Secondary | ICD-10-CM | POA: Diagnosis not present

## 2015-05-27 DIAGNOSIS — J449 Chronic obstructive pulmonary disease, unspecified: Secondary | ICD-10-CM | POA: Diagnosis not present

## 2015-05-29 DIAGNOSIS — N183 Chronic kidney disease, stage 3 (moderate): Secondary | ICD-10-CM | POA: Diagnosis not present

## 2015-05-29 DIAGNOSIS — L89312 Pressure ulcer of right buttock, stage 2: Secondary | ICD-10-CM | POA: Diagnosis not present

## 2015-05-29 DIAGNOSIS — I361 Nonrheumatic tricuspid (valve) insufficiency: Secondary | ICD-10-CM | POA: Diagnosis not present

## 2015-05-29 DIAGNOSIS — I480 Paroxysmal atrial fibrillation: Secondary | ICD-10-CM | POA: Diagnosis not present

## 2015-05-29 DIAGNOSIS — I4891 Unspecified atrial fibrillation: Secondary | ICD-10-CM | POA: Diagnosis not present

## 2015-05-29 DIAGNOSIS — J449 Chronic obstructive pulmonary disease, unspecified: Secondary | ICD-10-CM | POA: Diagnosis not present

## 2015-05-29 DIAGNOSIS — D649 Anemia, unspecified: Secondary | ICD-10-CM | POA: Diagnosis not present

## 2015-05-29 DIAGNOSIS — I5032 Chronic diastolic (congestive) heart failure: Secondary | ICD-10-CM | POA: Diagnosis not present

## 2015-05-29 DIAGNOSIS — I5031 Acute diastolic (congestive) heart failure: Secondary | ICD-10-CM | POA: Diagnosis not present

## 2015-05-29 DIAGNOSIS — J189 Pneumonia, unspecified organism: Secondary | ICD-10-CM | POA: Diagnosis not present

## 2015-05-29 DIAGNOSIS — Z9981 Dependence on supplemental oxygen: Secondary | ICD-10-CM | POA: Diagnosis not present

## 2015-05-30 DIAGNOSIS — J189 Pneumonia, unspecified organism: Secondary | ICD-10-CM | POA: Diagnosis not present

## 2015-05-30 DIAGNOSIS — L89312 Pressure ulcer of right buttock, stage 2: Secondary | ICD-10-CM | POA: Diagnosis not present

## 2015-05-30 DIAGNOSIS — J449 Chronic obstructive pulmonary disease, unspecified: Secondary | ICD-10-CM | POA: Diagnosis not present

## 2015-05-30 DIAGNOSIS — Z9981 Dependence on supplemental oxygen: Secondary | ICD-10-CM | POA: Diagnosis not present

## 2015-05-30 DIAGNOSIS — I4891 Unspecified atrial fibrillation: Secondary | ICD-10-CM | POA: Diagnosis not present

## 2015-05-30 DIAGNOSIS — I5031 Acute diastolic (congestive) heart failure: Secondary | ICD-10-CM | POA: Diagnosis not present

## 2015-05-30 DIAGNOSIS — D649 Anemia, unspecified: Secondary | ICD-10-CM | POA: Diagnosis not present

## 2015-05-31 DIAGNOSIS — Z0100 Encounter for examination of eyes and vision without abnormal findings: Secondary | ICD-10-CM | POA: Diagnosis not present

## 2015-06-05 DIAGNOSIS — J449 Chronic obstructive pulmonary disease, unspecified: Secondary | ICD-10-CM | POA: Diagnosis not present

## 2015-06-05 DIAGNOSIS — Z9981 Dependence on supplemental oxygen: Secondary | ICD-10-CM | POA: Diagnosis not present

## 2015-06-05 DIAGNOSIS — I5031 Acute diastolic (congestive) heart failure: Secondary | ICD-10-CM | POA: Diagnosis not present

## 2015-06-05 DIAGNOSIS — D649 Anemia, unspecified: Secondary | ICD-10-CM | POA: Diagnosis not present

## 2015-06-05 DIAGNOSIS — L89312 Pressure ulcer of right buttock, stage 2: Secondary | ICD-10-CM | POA: Diagnosis not present

## 2015-06-05 DIAGNOSIS — I4891 Unspecified atrial fibrillation: Secondary | ICD-10-CM | POA: Diagnosis not present

## 2015-06-05 DIAGNOSIS — J189 Pneumonia, unspecified organism: Secondary | ICD-10-CM | POA: Diagnosis not present

## 2015-06-07 DIAGNOSIS — J449 Chronic obstructive pulmonary disease, unspecified: Secondary | ICD-10-CM | POA: Diagnosis not present

## 2015-06-07 DIAGNOSIS — I4891 Unspecified atrial fibrillation: Secondary | ICD-10-CM | POA: Diagnosis not present

## 2015-06-07 DIAGNOSIS — D649 Anemia, unspecified: Secondary | ICD-10-CM | POA: Diagnosis not present

## 2015-06-07 DIAGNOSIS — J189 Pneumonia, unspecified organism: Secondary | ICD-10-CM | POA: Diagnosis not present

## 2015-06-07 DIAGNOSIS — Z7901 Long term (current) use of anticoagulants: Secondary | ICD-10-CM | POA: Diagnosis not present

## 2015-06-07 DIAGNOSIS — Z7951 Long term (current) use of inhaled steroids: Secondary | ICD-10-CM | POA: Diagnosis not present

## 2015-06-07 DIAGNOSIS — L89312 Pressure ulcer of right buttock, stage 2: Secondary | ICD-10-CM | POA: Diagnosis not present

## 2015-06-07 DIAGNOSIS — Z9981 Dependence on supplemental oxygen: Secondary | ICD-10-CM | POA: Diagnosis not present

## 2015-06-07 DIAGNOSIS — I5031 Acute diastolic (congestive) heart failure: Secondary | ICD-10-CM | POA: Diagnosis not present

## 2015-06-12 DIAGNOSIS — Z7901 Long term (current) use of anticoagulants: Secondary | ICD-10-CM | POA: Diagnosis not present

## 2015-06-12 DIAGNOSIS — L89312 Pressure ulcer of right buttock, stage 2: Secondary | ICD-10-CM | POA: Diagnosis not present

## 2015-06-12 DIAGNOSIS — Z9981 Dependence on supplemental oxygen: Secondary | ICD-10-CM | POA: Diagnosis not present

## 2015-06-12 DIAGNOSIS — I5031 Acute diastolic (congestive) heart failure: Secondary | ICD-10-CM | POA: Diagnosis not present

## 2015-06-12 DIAGNOSIS — D649 Anemia, unspecified: Secondary | ICD-10-CM | POA: Diagnosis not present

## 2015-06-12 DIAGNOSIS — J189 Pneumonia, unspecified organism: Secondary | ICD-10-CM | POA: Diagnosis not present

## 2015-06-12 DIAGNOSIS — J449 Chronic obstructive pulmonary disease, unspecified: Secondary | ICD-10-CM | POA: Diagnosis not present

## 2015-06-12 DIAGNOSIS — Z7951 Long term (current) use of inhaled steroids: Secondary | ICD-10-CM | POA: Diagnosis not present

## 2015-06-12 DIAGNOSIS — I4891 Unspecified atrial fibrillation: Secondary | ICD-10-CM | POA: Diagnosis not present

## 2015-06-14 DIAGNOSIS — I4891 Unspecified atrial fibrillation: Secondary | ICD-10-CM | POA: Diagnosis not present

## 2015-06-14 DIAGNOSIS — J189 Pneumonia, unspecified organism: Secondary | ICD-10-CM | POA: Diagnosis not present

## 2015-06-14 DIAGNOSIS — Z9981 Dependence on supplemental oxygen: Secondary | ICD-10-CM | POA: Diagnosis not present

## 2015-06-14 DIAGNOSIS — Z7951 Long term (current) use of inhaled steroids: Secondary | ICD-10-CM | POA: Diagnosis not present

## 2015-06-14 DIAGNOSIS — I5031 Acute diastolic (congestive) heart failure: Secondary | ICD-10-CM | POA: Diagnosis not present

## 2015-06-14 DIAGNOSIS — D649 Anemia, unspecified: Secondary | ICD-10-CM | POA: Diagnosis not present

## 2015-06-14 DIAGNOSIS — L89312 Pressure ulcer of right buttock, stage 2: Secondary | ICD-10-CM | POA: Diagnosis not present

## 2015-06-14 DIAGNOSIS — J449 Chronic obstructive pulmonary disease, unspecified: Secondary | ICD-10-CM | POA: Diagnosis not present

## 2015-06-14 DIAGNOSIS — Z7901 Long term (current) use of anticoagulants: Secondary | ICD-10-CM | POA: Diagnosis not present

## 2015-06-17 DIAGNOSIS — I5033 Acute on chronic diastolic (congestive) heart failure: Secondary | ICD-10-CM | POA: Diagnosis not present

## 2015-06-19 DIAGNOSIS — L89312 Pressure ulcer of right buttock, stage 2: Secondary | ICD-10-CM | POA: Diagnosis not present

## 2015-06-19 DIAGNOSIS — J449 Chronic obstructive pulmonary disease, unspecified: Secondary | ICD-10-CM | POA: Diagnosis not present

## 2015-06-19 DIAGNOSIS — I5031 Acute diastolic (congestive) heart failure: Secondary | ICD-10-CM | POA: Diagnosis not present

## 2015-06-19 DIAGNOSIS — D649 Anemia, unspecified: Secondary | ICD-10-CM | POA: Diagnosis not present

## 2015-06-19 DIAGNOSIS — Z7901 Long term (current) use of anticoagulants: Secondary | ICD-10-CM | POA: Diagnosis not present

## 2015-06-19 DIAGNOSIS — Z7951 Long term (current) use of inhaled steroids: Secondary | ICD-10-CM | POA: Diagnosis not present

## 2015-06-19 DIAGNOSIS — I4891 Unspecified atrial fibrillation: Secondary | ICD-10-CM | POA: Diagnosis not present

## 2015-06-19 DIAGNOSIS — Z9981 Dependence on supplemental oxygen: Secondary | ICD-10-CM | POA: Diagnosis not present

## 2015-06-19 DIAGNOSIS — J189 Pneumonia, unspecified organism: Secondary | ICD-10-CM | POA: Diagnosis not present

## 2015-06-21 DIAGNOSIS — L89312 Pressure ulcer of right buttock, stage 2: Secondary | ICD-10-CM | POA: Diagnosis not present

## 2015-06-21 DIAGNOSIS — H40003 Preglaucoma, unspecified, bilateral: Secondary | ICD-10-CM | POA: Diagnosis not present

## 2015-06-21 DIAGNOSIS — Z7951 Long term (current) use of inhaled steroids: Secondary | ICD-10-CM | POA: Diagnosis not present

## 2015-06-21 DIAGNOSIS — Z7901 Long term (current) use of anticoagulants: Secondary | ICD-10-CM | POA: Diagnosis not present

## 2015-06-21 DIAGNOSIS — H25813 Combined forms of age-related cataract, bilateral: Secondary | ICD-10-CM | POA: Diagnosis not present

## 2015-06-21 DIAGNOSIS — J449 Chronic obstructive pulmonary disease, unspecified: Secondary | ICD-10-CM | POA: Diagnosis not present

## 2015-06-21 DIAGNOSIS — I4891 Unspecified atrial fibrillation: Secondary | ICD-10-CM | POA: Diagnosis not present

## 2015-06-21 DIAGNOSIS — Z9981 Dependence on supplemental oxygen: Secondary | ICD-10-CM | POA: Diagnosis not present

## 2015-06-21 DIAGNOSIS — I5031 Acute diastolic (congestive) heart failure: Secondary | ICD-10-CM | POA: Diagnosis not present

## 2015-06-21 DIAGNOSIS — J189 Pneumonia, unspecified organism: Secondary | ICD-10-CM | POA: Diagnosis not present

## 2015-06-21 DIAGNOSIS — D649 Anemia, unspecified: Secondary | ICD-10-CM | POA: Diagnosis not present

## 2015-06-21 DIAGNOSIS — H527 Unspecified disorder of refraction: Secondary | ICD-10-CM | POA: Diagnosis not present

## 2015-06-27 DIAGNOSIS — N183 Chronic kidney disease, stage 3 (moderate): Secondary | ICD-10-CM | POA: Diagnosis not present

## 2015-06-27 DIAGNOSIS — Z5181 Encounter for therapeutic drug level monitoring: Secondary | ICD-10-CM | POA: Diagnosis not present

## 2015-06-27 DIAGNOSIS — Z79899 Other long term (current) drug therapy: Secondary | ICD-10-CM | POA: Diagnosis not present

## 2015-06-27 DIAGNOSIS — D500 Iron deficiency anemia secondary to blood loss (chronic): Secondary | ICD-10-CM | POA: Diagnosis not present

## 2015-06-27 DIAGNOSIS — I129 Hypertensive chronic kidney disease with stage 1 through stage 4 chronic kidney disease, or unspecified chronic kidney disease: Secondary | ICD-10-CM | POA: Diagnosis not present

## 2015-06-27 DIAGNOSIS — J449 Chronic obstructive pulmonary disease, unspecified: Secondary | ICD-10-CM | POA: Diagnosis not present

## 2015-06-27 DIAGNOSIS — E1122 Type 2 diabetes mellitus with diabetic chronic kidney disease: Secondary | ICD-10-CM | POA: Diagnosis not present

## 2015-06-27 DIAGNOSIS — D631 Anemia in chronic kidney disease: Secondary | ICD-10-CM | POA: Diagnosis not present

## 2015-06-27 DIAGNOSIS — E039 Hypothyroidism, unspecified: Secondary | ICD-10-CM | POA: Diagnosis not present

## 2015-06-27 DIAGNOSIS — E7800 Pure hypercholesterolemia, unspecified: Secondary | ICD-10-CM | POA: Diagnosis not present

## 2015-06-27 DIAGNOSIS — D509 Iron deficiency anemia, unspecified: Secondary | ICD-10-CM | POA: Diagnosis not present

## 2015-06-29 DIAGNOSIS — E1122 Type 2 diabetes mellitus with diabetic chronic kidney disease: Secondary | ICD-10-CM | POA: Diagnosis not present

## 2015-06-29 DIAGNOSIS — E039 Hypothyroidism, unspecified: Secondary | ICD-10-CM | POA: Diagnosis not present

## 2015-06-29 DIAGNOSIS — I129 Hypertensive chronic kidney disease with stage 1 through stage 4 chronic kidney disease, or unspecified chronic kidney disease: Secondary | ICD-10-CM | POA: Diagnosis not present

## 2015-06-29 DIAGNOSIS — Z79899 Other long term (current) drug therapy: Secondary | ICD-10-CM | POA: Diagnosis not present

## 2015-06-29 DIAGNOSIS — D500 Iron deficiency anemia secondary to blood loss (chronic): Secondary | ICD-10-CM | POA: Diagnosis not present

## 2015-06-29 DIAGNOSIS — Z5181 Encounter for therapeutic drug level monitoring: Secondary | ICD-10-CM | POA: Diagnosis not present

## 2015-06-29 DIAGNOSIS — N183 Chronic kidney disease, stage 3 (moderate): Secondary | ICD-10-CM | POA: Diagnosis not present

## 2015-06-29 DIAGNOSIS — D631 Anemia in chronic kidney disease: Secondary | ICD-10-CM | POA: Diagnosis not present

## 2015-06-29 DIAGNOSIS — E7800 Pure hypercholesterolemia, unspecified: Secondary | ICD-10-CM | POA: Diagnosis not present

## 2015-07-03 DIAGNOSIS — Z87891 Personal history of nicotine dependence: Secondary | ICD-10-CM | POA: Diagnosis not present

## 2015-07-03 DIAGNOSIS — I509 Heart failure, unspecified: Secondary | ICD-10-CM | POA: Diagnosis not present

## 2015-07-03 DIAGNOSIS — M1990 Unspecified osteoarthritis, unspecified site: Secondary | ICD-10-CM | POA: Diagnosis not present

## 2015-07-03 DIAGNOSIS — E669 Obesity, unspecified: Secondary | ICD-10-CM | POA: Diagnosis not present

## 2015-07-03 DIAGNOSIS — I130 Hypertensive heart and chronic kidney disease with heart failure and stage 1 through stage 4 chronic kidney disease, or unspecified chronic kidney disease: Secondary | ICD-10-CM | POA: Diagnosis not present

## 2015-07-03 DIAGNOSIS — E1122 Type 2 diabetes mellitus with diabetic chronic kidney disease: Secondary | ICD-10-CM | POA: Diagnosis not present

## 2015-07-03 DIAGNOSIS — K219 Gastro-esophageal reflux disease without esophagitis: Secondary | ICD-10-CM | POA: Diagnosis not present

## 2015-07-03 DIAGNOSIS — E785 Hyperlipidemia, unspecified: Secondary | ICD-10-CM | POA: Diagnosis not present

## 2015-07-03 DIAGNOSIS — N183 Chronic kidney disease, stage 3 (moderate): Secondary | ICD-10-CM | POA: Diagnosis not present

## 2015-07-03 DIAGNOSIS — H25812 Combined forms of age-related cataract, left eye: Secondary | ICD-10-CM | POA: Diagnosis not present

## 2015-07-03 DIAGNOSIS — H2181 Floppy iris syndrome: Secondary | ICD-10-CM | POA: Diagnosis not present

## 2015-07-12 DIAGNOSIS — H527 Unspecified disorder of refraction: Secondary | ICD-10-CM | POA: Diagnosis not present

## 2015-07-12 DIAGNOSIS — I4891 Unspecified atrial fibrillation: Secondary | ICD-10-CM | POA: Diagnosis not present

## 2015-07-12 DIAGNOSIS — E669 Obesity, unspecified: Secondary | ICD-10-CM | POA: Diagnosis not present

## 2015-07-12 DIAGNOSIS — J449 Chronic obstructive pulmonary disease, unspecified: Secondary | ICD-10-CM | POA: Diagnosis not present

## 2015-07-12 DIAGNOSIS — Z87891 Personal history of nicotine dependence: Secondary | ICD-10-CM | POA: Diagnosis not present

## 2015-07-12 DIAGNOSIS — H40003 Preglaucoma, unspecified, bilateral: Secondary | ICD-10-CM | POA: Diagnosis not present

## 2015-07-12 DIAGNOSIS — Z79899 Other long term (current) drug therapy: Secondary | ICD-10-CM | POA: Diagnosis not present

## 2015-07-12 DIAGNOSIS — I10 Essential (primary) hypertension: Secondary | ICD-10-CM | POA: Diagnosis not present

## 2015-07-12 DIAGNOSIS — E1136 Type 2 diabetes mellitus with diabetic cataract: Secondary | ICD-10-CM | POA: Diagnosis not present

## 2015-07-12 DIAGNOSIS — E039 Hypothyroidism, unspecified: Secondary | ICD-10-CM | POA: Diagnosis not present

## 2015-07-12 DIAGNOSIS — E785 Hyperlipidemia, unspecified: Secondary | ICD-10-CM | POA: Diagnosis not present

## 2015-07-12 DIAGNOSIS — K219 Gastro-esophageal reflux disease without esophagitis: Secondary | ICD-10-CM | POA: Diagnosis not present

## 2015-07-12 DIAGNOSIS — H25811 Combined forms of age-related cataract, right eye: Secondary | ICD-10-CM | POA: Diagnosis not present

## 2015-07-12 DIAGNOSIS — H25813 Combined forms of age-related cataract, bilateral: Secondary | ICD-10-CM | POA: Diagnosis not present

## 2015-07-18 DIAGNOSIS — I5033 Acute on chronic diastolic (congestive) heart failure: Secondary | ICD-10-CM | POA: Diagnosis not present

## 2015-07-19 DIAGNOSIS — H2512 Age-related nuclear cataract, left eye: Secondary | ICD-10-CM | POA: Diagnosis not present

## 2015-07-20 DIAGNOSIS — I129 Hypertensive chronic kidney disease with stage 1 through stage 4 chronic kidney disease, or unspecified chronic kidney disease: Secondary | ICD-10-CM | POA: Diagnosis not present

## 2015-07-20 DIAGNOSIS — D631 Anemia in chronic kidney disease: Secondary | ICD-10-CM | POA: Diagnosis not present

## 2015-07-20 DIAGNOSIS — D500 Iron deficiency anemia secondary to blood loss (chronic): Secondary | ICD-10-CM | POA: Diagnosis not present

## 2015-07-20 DIAGNOSIS — N183 Chronic kidney disease, stage 3 (moderate): Secondary | ICD-10-CM | POA: Diagnosis not present

## 2015-07-20 DIAGNOSIS — Z79899 Other long term (current) drug therapy: Secondary | ICD-10-CM | POA: Diagnosis not present

## 2015-07-20 DIAGNOSIS — E7800 Pure hypercholesterolemia, unspecified: Secondary | ICD-10-CM | POA: Diagnosis not present

## 2015-07-20 DIAGNOSIS — E039 Hypothyroidism, unspecified: Secondary | ICD-10-CM | POA: Diagnosis not present

## 2015-07-20 DIAGNOSIS — K558 Other vascular disorders of intestine: Secondary | ICD-10-CM | POA: Diagnosis not present

## 2015-07-20 DIAGNOSIS — E1122 Type 2 diabetes mellitus with diabetic chronic kidney disease: Secondary | ICD-10-CM | POA: Diagnosis not present

## 2015-07-20 DIAGNOSIS — Z5181 Encounter for therapeutic drug level monitoring: Secondary | ICD-10-CM | POA: Diagnosis not present

## 2015-07-27 DIAGNOSIS — J449 Chronic obstructive pulmonary disease, unspecified: Secondary | ICD-10-CM | POA: Diagnosis not present

## 2015-07-27 DIAGNOSIS — I5033 Acute on chronic diastolic (congestive) heart failure: Secondary | ICD-10-CM | POA: Diagnosis not present

## 2015-07-27 DIAGNOSIS — N183 Chronic kidney disease, stage 3 (moderate): Secondary | ICD-10-CM | POA: Diagnosis not present

## 2015-07-30 DIAGNOSIS — E1122 Type 2 diabetes mellitus with diabetic chronic kidney disease: Secondary | ICD-10-CM | POA: Diagnosis not present

## 2015-07-30 DIAGNOSIS — N183 Chronic kidney disease, stage 3 (moderate): Secondary | ICD-10-CM | POA: Diagnosis not present

## 2015-07-30 DIAGNOSIS — I129 Hypertensive chronic kidney disease with stage 1 through stage 4 chronic kidney disease, or unspecified chronic kidney disease: Secondary | ICD-10-CM | POA: Diagnosis not present

## 2015-07-30 DIAGNOSIS — D509 Iron deficiency anemia, unspecified: Secondary | ICD-10-CM | POA: Diagnosis not present

## 2015-07-30 DIAGNOSIS — Z9981 Dependence on supplemental oxygen: Secondary | ICD-10-CM | POA: Diagnosis not present

## 2015-07-30 DIAGNOSIS — D72829 Elevated white blood cell count, unspecified: Secondary | ICD-10-CM | POA: Diagnosis not present

## 2015-07-30 DIAGNOSIS — D473 Essential (hemorrhagic) thrombocythemia: Secondary | ICD-10-CM | POA: Diagnosis not present

## 2015-08-01 DIAGNOSIS — I482 Chronic atrial fibrillation: Secondary | ICD-10-CM | POA: Diagnosis not present

## 2015-08-01 DIAGNOSIS — J439 Emphysema, unspecified: Secondary | ICD-10-CM | POA: Diagnosis not present

## 2015-08-01 DIAGNOSIS — N183 Chronic kidney disease, stage 3 (moderate): Secondary | ICD-10-CM | POA: Diagnosis not present

## 2015-08-01 DIAGNOSIS — I5032 Chronic diastolic (congestive) heart failure: Secondary | ICD-10-CM | POA: Diagnosis not present

## 2015-08-02 DIAGNOSIS — D509 Iron deficiency anemia, unspecified: Secondary | ICD-10-CM | POA: Diagnosis not present

## 2015-08-02 DIAGNOSIS — D72829 Elevated white blood cell count, unspecified: Secondary | ICD-10-CM | POA: Diagnosis not present

## 2015-08-02 DIAGNOSIS — N183 Chronic kidney disease, stage 3 (moderate): Secondary | ICD-10-CM | POA: Diagnosis not present

## 2015-08-02 DIAGNOSIS — E1122 Type 2 diabetes mellitus with diabetic chronic kidney disease: Secondary | ICD-10-CM | POA: Diagnosis not present

## 2015-08-02 DIAGNOSIS — Z9981 Dependence on supplemental oxygen: Secondary | ICD-10-CM | POA: Diagnosis not present

## 2015-08-02 DIAGNOSIS — D631 Anemia in chronic kidney disease: Secondary | ICD-10-CM | POA: Diagnosis not present

## 2015-08-02 DIAGNOSIS — D473 Essential (hemorrhagic) thrombocythemia: Secondary | ICD-10-CM | POA: Diagnosis not present

## 2015-08-02 DIAGNOSIS — I129 Hypertensive chronic kidney disease with stage 1 through stage 4 chronic kidney disease, or unspecified chronic kidney disease: Secondary | ICD-10-CM | POA: Diagnosis not present

## 2015-08-06 DIAGNOSIS — E1122 Type 2 diabetes mellitus with diabetic chronic kidney disease: Secondary | ICD-10-CM | POA: Diagnosis not present

## 2015-08-06 DIAGNOSIS — I5032 Chronic diastolic (congestive) heart failure: Secondary | ICD-10-CM | POA: Diagnosis not present

## 2015-08-06 DIAGNOSIS — R040 Epistaxis: Secondary | ICD-10-CM | POA: Diagnosis not present

## 2015-08-06 DIAGNOSIS — J3489 Other specified disorders of nose and nasal sinuses: Secondary | ICD-10-CM | POA: Diagnosis not present

## 2015-08-06 DIAGNOSIS — D509 Iron deficiency anemia, unspecified: Secondary | ICD-10-CM | POA: Diagnosis not present

## 2015-08-06 DIAGNOSIS — D72829 Elevated white blood cell count, unspecified: Secondary | ICD-10-CM | POA: Diagnosis not present

## 2015-08-06 DIAGNOSIS — D473 Essential (hemorrhagic) thrombocythemia: Secondary | ICD-10-CM | POA: Diagnosis not present

## 2015-08-06 DIAGNOSIS — Z9981 Dependence on supplemental oxygen: Secondary | ICD-10-CM | POA: Diagnosis not present

## 2015-08-06 DIAGNOSIS — I482 Chronic atrial fibrillation: Secondary | ICD-10-CM | POA: Diagnosis not present

## 2015-08-06 DIAGNOSIS — N183 Chronic kidney disease, stage 3 (moderate): Secondary | ICD-10-CM | POA: Diagnosis not present

## 2015-08-06 DIAGNOSIS — J439 Emphysema, unspecified: Secondary | ICD-10-CM | POA: Diagnosis not present

## 2015-08-06 DIAGNOSIS — I129 Hypertensive chronic kidney disease with stage 1 through stage 4 chronic kidney disease, or unspecified chronic kidney disease: Secondary | ICD-10-CM | POA: Diagnosis not present

## 2015-08-08 DIAGNOSIS — I5032 Chronic diastolic (congestive) heart failure: Secondary | ICD-10-CM | POA: Diagnosis not present

## 2015-08-08 DIAGNOSIS — N183 Chronic kidney disease, stage 3 (moderate): Secondary | ICD-10-CM | POA: Diagnosis not present

## 2015-08-08 DIAGNOSIS — I482 Chronic atrial fibrillation: Secondary | ICD-10-CM | POA: Diagnosis not present

## 2015-08-08 DIAGNOSIS — J439 Emphysema, unspecified: Secondary | ICD-10-CM | POA: Diagnosis not present

## 2015-08-16 DIAGNOSIS — E1122 Type 2 diabetes mellitus with diabetic chronic kidney disease: Secondary | ICD-10-CM | POA: Diagnosis not present

## 2015-08-16 DIAGNOSIS — D509 Iron deficiency anemia, unspecified: Secondary | ICD-10-CM | POA: Diagnosis not present

## 2015-08-16 DIAGNOSIS — Z9981 Dependence on supplemental oxygen: Secondary | ICD-10-CM | POA: Diagnosis not present

## 2015-08-16 DIAGNOSIS — N183 Chronic kidney disease, stage 3 (moderate): Secondary | ICD-10-CM | POA: Diagnosis not present

## 2015-08-16 DIAGNOSIS — D473 Essential (hemorrhagic) thrombocythemia: Secondary | ICD-10-CM | POA: Diagnosis not present

## 2015-08-16 DIAGNOSIS — I129 Hypertensive chronic kidney disease with stage 1 through stage 4 chronic kidney disease, or unspecified chronic kidney disease: Secondary | ICD-10-CM | POA: Diagnosis not present

## 2015-08-16 DIAGNOSIS — D72829 Elevated white blood cell count, unspecified: Secondary | ICD-10-CM | POA: Diagnosis not present

## 2015-08-17 DIAGNOSIS — I5033 Acute on chronic diastolic (congestive) heart failure: Secondary | ICD-10-CM | POA: Diagnosis not present

## 2015-08-24 DIAGNOSIS — E876 Hypokalemia: Secondary | ICD-10-CM | POA: Diagnosis not present

## 2015-08-27 DIAGNOSIS — J449 Chronic obstructive pulmonary disease, unspecified: Secondary | ICD-10-CM | POA: Diagnosis not present

## 2015-08-30 DIAGNOSIS — D500 Iron deficiency anemia secondary to blood loss (chronic): Secondary | ICD-10-CM | POA: Diagnosis not present

## 2015-08-30 DIAGNOSIS — J449 Chronic obstructive pulmonary disease, unspecified: Secondary | ICD-10-CM | POA: Diagnosis not present

## 2015-08-30 DIAGNOSIS — N183 Chronic kidney disease, stage 3 (moderate): Secondary | ICD-10-CM | POA: Diagnosis not present

## 2015-08-30 DIAGNOSIS — D649 Anemia, unspecified: Secondary | ICD-10-CM | POA: Diagnosis not present

## 2015-08-30 DIAGNOSIS — D631 Anemia in chronic kidney disease: Secondary | ICD-10-CM | POA: Diagnosis not present

## 2015-08-30 DIAGNOSIS — D696 Thrombocytopenia, unspecified: Secondary | ICD-10-CM | POA: Diagnosis not present

## 2015-08-30 DIAGNOSIS — D72829 Elevated white blood cell count, unspecified: Secondary | ICD-10-CM | POA: Diagnosis not present

## 2015-08-30 DIAGNOSIS — Z862 Personal history of diseases of the blood and blood-forming organs and certain disorders involving the immune mechanism: Secondary | ICD-10-CM | POA: Diagnosis not present

## 2015-08-30 DIAGNOSIS — Z9981 Dependence on supplemental oxygen: Secondary | ICD-10-CM | POA: Diagnosis not present

## 2015-08-30 DIAGNOSIS — J439 Emphysema, unspecified: Secondary | ICD-10-CM | POA: Diagnosis not present

## 2015-09-13 DIAGNOSIS — D500 Iron deficiency anemia secondary to blood loss (chronic): Secondary | ICD-10-CM | POA: Diagnosis not present

## 2015-09-13 DIAGNOSIS — J449 Chronic obstructive pulmonary disease, unspecified: Secondary | ICD-10-CM | POA: Diagnosis not present

## 2015-09-13 DIAGNOSIS — Z9981 Dependence on supplemental oxygen: Secondary | ICD-10-CM | POA: Diagnosis not present

## 2015-09-13 DIAGNOSIS — J439 Emphysema, unspecified: Secondary | ICD-10-CM | POA: Diagnosis not present

## 2015-09-13 DIAGNOSIS — D649 Anemia, unspecified: Secondary | ICD-10-CM | POA: Diagnosis not present

## 2015-09-13 DIAGNOSIS — N183 Chronic kidney disease, stage 3 (moderate): Secondary | ICD-10-CM | POA: Diagnosis not present

## 2015-09-13 DIAGNOSIS — D631 Anemia in chronic kidney disease: Secondary | ICD-10-CM | POA: Diagnosis not present

## 2015-09-13 DIAGNOSIS — D696 Thrombocytopenia, unspecified: Secondary | ICD-10-CM | POA: Diagnosis not present

## 2015-09-13 DIAGNOSIS — D72829 Elevated white blood cell count, unspecified: Secondary | ICD-10-CM | POA: Diagnosis not present

## 2015-09-17 DIAGNOSIS — I5033 Acute on chronic diastolic (congestive) heart failure: Secondary | ICD-10-CM | POA: Diagnosis not present

## 2015-09-27 DIAGNOSIS — J449 Chronic obstructive pulmonary disease, unspecified: Secondary | ICD-10-CM | POA: Diagnosis not present

## 2015-10-04 DIAGNOSIS — R05 Cough: Secondary | ICD-10-CM | POA: Diagnosis not present

## 2015-10-04 DIAGNOSIS — R0902 Hypoxemia: Secondary | ICD-10-CM | POA: Diagnosis not present

## 2015-10-04 DIAGNOSIS — J449 Chronic obstructive pulmonary disease, unspecified: Secondary | ICD-10-CM | POA: Diagnosis not present

## 2015-10-18 DIAGNOSIS — Z862 Personal history of diseases of the blood and blood-forming organs and certain disorders involving the immune mechanism: Secondary | ICD-10-CM | POA: Diagnosis not present

## 2015-10-18 DIAGNOSIS — D500 Iron deficiency anemia secondary to blood loss (chronic): Secondary | ICD-10-CM | POA: Diagnosis not present

## 2015-10-18 DIAGNOSIS — D473 Essential (hemorrhagic) thrombocythemia: Secondary | ICD-10-CM | POA: Diagnosis not present

## 2015-10-18 DIAGNOSIS — I5033 Acute on chronic diastolic (congestive) heart failure: Secondary | ICD-10-CM | POA: Diagnosis not present

## 2015-10-18 DIAGNOSIS — Z09 Encounter for follow-up examination after completed treatment for conditions other than malignant neoplasm: Secondary | ICD-10-CM | POA: Diagnosis not present

## 2015-10-18 DIAGNOSIS — N183 Chronic kidney disease, stage 3 (moderate): Secondary | ICD-10-CM | POA: Diagnosis not present

## 2015-10-18 DIAGNOSIS — D72829 Elevated white blood cell count, unspecified: Secondary | ICD-10-CM | POA: Diagnosis not present

## 2015-10-19 DIAGNOSIS — Z23 Encounter for immunization: Secondary | ICD-10-CM | POA: Diagnosis not present

## 2015-10-27 DIAGNOSIS — J449 Chronic obstructive pulmonary disease, unspecified: Secondary | ICD-10-CM | POA: Diagnosis not present

## 2015-11-01 DIAGNOSIS — I482 Chronic atrial fibrillation: Secondary | ICD-10-CM | POA: Diagnosis not present

## 2015-11-01 DIAGNOSIS — N183 Chronic kidney disease, stage 3 (moderate): Secondary | ICD-10-CM | POA: Diagnosis not present

## 2015-11-01 DIAGNOSIS — I5032 Chronic diastolic (congestive) heart failure: Secondary | ICD-10-CM | POA: Diagnosis not present

## 2015-11-01 DIAGNOSIS — I10 Essential (primary) hypertension: Secondary | ICD-10-CM | POA: Diagnosis not present

## 2015-11-01 DIAGNOSIS — J449 Chronic obstructive pulmonary disease, unspecified: Secondary | ICD-10-CM | POA: Diagnosis not present

## 2015-11-17 DIAGNOSIS — I5033 Acute on chronic diastolic (congestive) heart failure: Secondary | ICD-10-CM | POA: Diagnosis not present

## 2015-11-27 DIAGNOSIS — J449 Chronic obstructive pulmonary disease, unspecified: Secondary | ICD-10-CM | POA: Diagnosis not present

## 2015-11-28 DIAGNOSIS — I10 Essential (primary) hypertension: Secondary | ICD-10-CM | POA: Diagnosis not present

## 2015-11-28 DIAGNOSIS — Z1231 Encounter for screening mammogram for malignant neoplasm of breast: Secondary | ICD-10-CM | POA: Diagnosis not present

## 2015-11-28 DIAGNOSIS — M8580 Other specified disorders of bone density and structure, unspecified site: Secondary | ICD-10-CM | POA: Diagnosis not present

## 2015-11-28 DIAGNOSIS — R5383 Other fatigue: Secondary | ICD-10-CM | POA: Diagnosis not present

## 2015-11-28 DIAGNOSIS — N183 Chronic kidney disease, stage 3 (moderate): Secondary | ICD-10-CM | POA: Diagnosis not present

## 2015-11-28 DIAGNOSIS — E039 Hypothyroidism, unspecified: Secondary | ICD-10-CM | POA: Diagnosis not present

## 2015-11-28 DIAGNOSIS — D509 Iron deficiency anemia, unspecified: Secondary | ICD-10-CM | POA: Diagnosis not present

## 2015-11-28 DIAGNOSIS — R828 Abnormal findings on cytological and histological examination of urine: Secondary | ICD-10-CM | POA: Diagnosis not present

## 2015-11-28 DIAGNOSIS — Z0000 Encounter for general adult medical examination without abnormal findings: Secondary | ICD-10-CM | POA: Diagnosis not present

## 2015-11-28 DIAGNOSIS — R7309 Other abnormal glucose: Secondary | ICD-10-CM | POA: Diagnosis not present

## 2015-12-04 ENCOUNTER — Other Ambulatory Visit: Payer: Self-pay | Admitting: Unknown Physician Specialty

## 2015-12-04 DIAGNOSIS — Z1239 Encounter for other screening for malignant neoplasm of breast: Secondary | ICD-10-CM

## 2015-12-07 ENCOUNTER — Ambulatory Visit: Payer: Medicare Other

## 2015-12-07 ENCOUNTER — Emergency Department (INDEPENDENT_AMBULATORY_CARE_PROVIDER_SITE_OTHER): Payer: Medicare Other

## 2015-12-07 ENCOUNTER — Encounter: Payer: Self-pay | Admitting: *Deleted

## 2015-12-07 ENCOUNTER — Emergency Department
Admission: EM | Admit: 2015-12-07 | Discharge: 2015-12-07 | Disposition: A | Discharge status: Home / Self Care | Payer: Medicare Other | Source: Home / Self Care | Attending: Family Medicine | Admitting: Family Medicine

## 2015-12-07 ENCOUNTER — Telehealth: Payer: Self-pay | Admitting: Emergency Medicine

## 2015-12-07 DIAGNOSIS — R0902 Hypoxemia: Secondary | ICD-10-CM | POA: Diagnosis not present

## 2015-12-07 DIAGNOSIS — R0602 Shortness of breath: Secondary | ICD-10-CM

## 2015-12-07 DIAGNOSIS — J441 Chronic obstructive pulmonary disease with (acute) exacerbation: Secondary | ICD-10-CM

## 2015-12-07 MED ORDER — METHYLPREDNISOLONE SODIUM SUCC 40 MG IJ SOLR
40.0000 mg | Freq: Once | INTRAMUSCULAR | Status: AC
  Administered 2015-12-07: 40 mg via INTRAMUSCULAR

## 2015-12-07 MED ORDER — PREDNISONE 20 MG PO TABS: ORAL_TABLET | ORAL | 0 refills | Status: AC

## 2015-12-07 MED ORDER — IPRATROPIUM-ALBUTEROL 0.5-2.5 (3) MG/3ML IN SOLN
3.0000 mL | Freq: Four times a day (QID) | RESPIRATORY_TRACT | Status: DC
  Administered 2015-12-07: 3 mL via RESPIRATORY_TRACT

## 2015-12-07 MED ORDER — DOXYCYCLINE HYCLATE 100 MG PO CAPS: 100.0000 mg | ORAL_CAPSULE | Freq: Two times a day (BID) | ORAL | 0 refills | Status: AC

## 2015-12-07 MED ORDER — IPRATROPIUM-ALBUTEROL 0.5-2.5 (3) MG/3ML IN SOLN
3.0000 mL | Freq: Once | RESPIRATORY_TRACT | Status: AC
  Administered 2015-12-07: 3 mL via RESPIRATORY_TRACT

## 2015-12-07 NOTE — ED Triage Notes (Signed)
Patient c/o 2 weeks of progressive nasal tightness and congestion and periodic bleeds. Her normal 02 is low 90s, recently dropping into upper 70s, low 80s with movement.She typically is on 2L [place] @ home, bumped up to 3 since this started. She has not had recent URI and otherwise feels well.

## 2015-12-07 NOTE — Discharge Instructions (Signed)
°  You were given a shot of solumedrol (a steroid) today to help with breathing and inflammation in your lungs.  You have been prescribed prednisone, an oral steroid.  You may start this medication tomorrow with breakfast.

## 2015-12-07 NOTE — ED Provider Notes (Signed)
CSN: [id number]     Arrival date & time 12/07/15  1413 History   First MD Initiated Contact with Patient 12/07/15 1441     Chief Complaint  Patient presents with  . Low 02 Sat  . Nasal Congestion   (Consider location/radiation/quality/duration/timing/severity/associated sxs/prior Treatment) HPI Brittany Snow is a 75 y.o. female presenting to UC with her husband from imaging department for evaluation of low O2.  Pt was at [facility] for a routine mammogram but found to have low O2 readings.  Pt does report mild intermittent cough and worsening SOB over the last 1 week. She is on daily O2 via NA at 2L but has bumped it up to 3L this week.  She notes she has recently had her O2 drop into the 70s and 80s but normal for her is upper 80s-low 90s.  Denies chest pain at this time. Denies fever. Denies leg swelling and in fact, notes her leg swelling has improved significantly over the last 2 months. Pt questions if her dry nose and nasal congestion is blocking the flow of her oxygen as she does feel her nose is dry despite using nasal saline and gel at home.  She also occasionally uses a humidifier at home. Pt reports being hospitalized for pneumonia earlier in the year but does not feel bad like she did then.    Past Medical History:  Diagnosis Date  . Atrial fibrillation ([place])   . CHF (congestive heart failure) ([place])   . COPD (chronic obstructive pulmonary disease) ([place])    History reviewed. No pertinent surgical history. History reviewed. No pertinent family history. Social History  Substance Use Topics  . Smoking status: Former Smoker    Packs/day: 1.00    Types: Cigarettes    Start date: 01/21/1968    Quit date: 01/21/1983  . Smokeless tobacco: Never Used  . Alcohol use No   OB History    No data available     Review of Systems  Constitutional: Negative for chills and fever.  HENT: Positive for congestion ( mild). Negative for ear pain, sore throat, trouble swallowing and  voice change.   Respiratory: Positive for cough, shortness of breath and wheezing.   Cardiovascular: Negative for chest pain and palpitations.  Gastrointestinal: Negative for abdominal pain, diarrhea, nausea and vomiting.  Musculoskeletal: Negative for arthralgias, back pain and myalgias.  Skin: Negative for rash.    Allergies  Patient has no known allergies.  Home Medications   Prior to Admission medications   Medication Sig Start Date End Date Taking? Authorizing Provider  apixaban (ELIQUIS) 5 MG TABS tablet Take 5 mg by mouth 2 (two) times daily.   Yes Historical Provider, MD  atorvastatin (LIPITOR) 10 MG tablet Take 10 mg by mouth daily.   Yes Historical Provider, MD  budesonide-formoterol (SYMBICORT) 160-4.5 MCG/ACT inhaler Inhale 2 puffs into the lungs 2 (two) times daily.   Yes Historical Provider, MD  furosemide (LASIX) 40 MG tablet Take 1 tablet (40 mg total) by mouth daily. 03/22/15  Yes Verlee Monte, MD  levothyroxine (SYNTHROID, LEVOTHROID) 75 MCG tablet Take 75 mcg by mouth daily before breakfast.   Yes Historical Provider, MD  metoprolol succinate (TOPROL-XL) 100 MG 24 hr tablet Take 1 tablet (100 mg total) by mouth daily. Take with or immediately following a meal. 03/22/15  Yes Verlee Monte, MD  omeprazole (PRILOSEC) 40 MG capsule Take 40 mg by mouth daily.   Yes Historical Provider, MD  Potassium 75 MG TABS Take  by mouth.   Yes Historical Provider, MD  tiotropium (SPIRIVA) 18 MCG inhalation capsule Place 18 mcg into inhaler and inhale daily.   Yes Historical Provider, MD  acetaminophen (TYLENOL) 500 MG tablet Take 500 mg by mouth every 4 (four) hours as needed for mild pain or headache.    Historical Provider, MD  albuterol (PROVENTIL HFA;VENTOLIN HFA) 108 (90 Base) MCG/ACT inhaler Inhale 2 puffs into the lungs every 4 (four) hours as needed for wheezing or shortness of breath.    Historical Provider, MD  cetirizine (ZYRTEC) 10 MG tablet Take 10 mg by mouth as needed for  allergies.    Historical Provider, MD  Cholecalciferol (VITAMIN D3) 5000 units TABS Take 5,000 Units by mouth daily.    Historical Provider, MD  diltiazem (DILACOR XR) 180 MG 24 hr capsule Take 1 capsule (180 mg total) by mouth 2 (two) times daily. 03/22/15   Verlee Monte, MD  docusate sodium (COLACE) 100 MG capsule Take 100 mg by mouth daily as needed for mild constipation.    Historical Provider, MD  doxycycline (VIBRAMYCIN) 100 MG capsule Take 1 capsule (100 mg total) by mouth 2 (two) times daily. One po bid x 7 days 12/07/15   Noland Fordyce, PA-C  iron polysaccharides (NIFEREX) 150 MG capsule Take 150 mg by mouth 2 (two) times daily.    Historical Provider, MD  Melatonin 5 MG TABS Take 5 mg by mouth at bedtime.    Historical Provider, MD  polyethylene glycol (MIRALAX / GLYCOLAX) packet Take 17 g by mouth daily.    Historical Provider, MD  predniSONE (DELTASONE) 20 MG tablet 2 tabs po day one, then 1 po daily x 4 days 12/07/15   Noland Fordyce, PA-C  ranitidine (ZANTAC) 300 MG tablet Take 300 mg by mouth daily.    Historical Provider, MD  triamcinolone (NASACORT ALLERGY 24HR) 55 MCG/ACT AERO nasal inhaler Place 2 sprays into the nose as needed (for allergies).    Historical Provider, MD   Meds Ordered and Administered this Visit   Medications  ipratropium-albuterol (DUONEB) 0.5-2.5 (3) MG/3ML nebulizer solution 3 mL (3 mLs Nebulization Given 12/07/15 1533)  methylPREDNISolone sodium succinate (SOLU-MEDROL) 40 mg/mL injection 40 mg (40 mg Intramuscular Given 12/07/15 1630)    BP 106/66 (BP Location: Left Arm)   Pulse 83   Temp 98.2 F (36.8 C) (Oral)   Resp 18   Wt 154 lb (69.9 kg)   SpO2 (!) 89%   BMI 30.08 kg/m  No data found.   Physical Exam  Constitutional: She appears well-developed and well-nourished. No distress.  Elderly female sitting in wheelchair, NAD. Nasal canula in place. NAD.  HENT:  Head: Normocephalic and atraumatic.  Eyes: Conjunctivae are normal. No scleral  icterus.  Neck: Normal range of motion. Neck supple.  Cardiovascular: Normal rate, regular rhythm and normal heart sounds.   Pulmonary/Chest: Effort normal. No respiratory distress. She has decreased breath sounds in the right lower field and the left lower field. She has wheezes. She has rales. She exhibits no tenderness.  Abdominal: Soft. She exhibits no distension. There is no tenderness.  Musculoskeletal: Normal range of motion. She exhibits no edema.  Neurological: She is alert.  Skin: Skin is warm and dry. Capillary refill takes less than 2 seconds. She is not diaphoretic.  Nursing note and vitals reviewed.   Urgent Care Course   Clinical Course     Procedures (including critical care time)  Labs Review Labs Reviewed - No data to display  Imaging Review Dg Chest 2 View  Result Date: 12/07/2015 CLINICAL DATA:  Shortness of breath, hypoxia. EXAM: CHEST  2 VIEW COMPARISON:  03/19/2015 FINDINGS: Heart is borderline in size. There is diffuse interstitial prominence throughout the lungs, increased since prior study, likely interstitial edema. No confluent opacities or pleural effusions. IMPRESSION: Borderline heart size. Increasing interstitial prominence throughout the lungs concerning for interstitial edema. Electronically Signed   By: Rolm Baptise M.D.   On: 12/07/2015 15:31     MDM   1. COPD exacerbation ([place])    Pt presenting with O2 Sat of 82% on her home Nasal canula O2.   CXR: increasing interstitial prominence throughout the lungs concerning for interstitial edema.  O2 Sat improved from 82% to 89% after duoneb and Solumedrol treatment in UC.  Reviewed pt with Dr. Dianah Field.  Agreed pt is stable for discharge home. Will treat pt for COPD exacerbation and encouraged f/u with PCP Monday.  Rx: Doxycycline and prednisone   Discussed symptoms that warrant emergent care in the ED. Patient and husband verbalized understanding and agreement with treatment plan.     Noland Fordyce, PA-C 12/07/15 [phone]

## 2015-12-17 DIAGNOSIS — R0902 Hypoxemia: Secondary | ICD-10-CM | POA: Diagnosis not present

## 2015-12-17 DIAGNOSIS — I509 Heart failure, unspecified: Secondary | ICD-10-CM | POA: Diagnosis not present

## 2015-12-17 DIAGNOSIS — I10 Essential (primary) hypertension: Secondary | ICD-10-CM | POA: Diagnosis not present

## 2015-12-17 DIAGNOSIS — J449 Chronic obstructive pulmonary disease, unspecified: Secondary | ICD-10-CM | POA: Diagnosis not present

## 2015-12-18 DIAGNOSIS — I5033 Acute on chronic diastolic (congestive) heart failure: Secondary | ICD-10-CM | POA: Diagnosis not present

## 2015-12-27 DIAGNOSIS — J449 Chronic obstructive pulmonary disease, unspecified: Secondary | ICD-10-CM | POA: Diagnosis not present

## 2016-01-11 DIAGNOSIS — I482 Chronic atrial fibrillation: Secondary | ICD-10-CM | POA: Diagnosis not present

## 2016-01-11 DIAGNOSIS — I5033 Acute on chronic diastolic (congestive) heart failure: Secondary | ICD-10-CM | POA: Diagnosis not present

## 2016-01-11 DIAGNOSIS — I97121 Postprocedural cardiac arrest following other surgery: Secondary | ICD-10-CM | POA: Diagnosis not present

## 2016-01-11 DIAGNOSIS — J9811 Atelectasis: Secondary | ICD-10-CM | POA: Diagnosis not present

## 2016-01-11 DIAGNOSIS — R0689 Other abnormalities of breathing: Secondary | ICD-10-CM | POA: Diagnosis not present

## 2016-01-11 DIAGNOSIS — I469 Cardiac arrest, cause unspecified: Secondary | ICD-10-CM | POA: Diagnosis not present

## 2016-01-11 DIAGNOSIS — K449 Diaphragmatic hernia without obstruction or gangrene: Secondary | ICD-10-CM | POA: Diagnosis not present

## 2016-01-11 DIAGNOSIS — G9340 Encephalopathy, unspecified: Secondary | ICD-10-CM | POA: Diagnosis not present

## 2016-01-11 DIAGNOSIS — J90 Pleural effusion, not elsewhere classified: Secondary | ICD-10-CM | POA: Diagnosis not present

## 2016-01-11 DIAGNOSIS — Z452 Encounter for adjustment and management of vascular access device: Secondary | ICD-10-CM | POA: Diagnosis not present

## 2016-01-11 DIAGNOSIS — R0603 Acute respiratory distress: Secondary | ICD-10-CM | POA: Diagnosis not present

## 2016-01-11 DIAGNOSIS — E1122 Type 2 diabetes mellitus with diabetic chronic kidney disease: Secondary | ICD-10-CM | POA: Diagnosis not present

## 2016-01-11 DIAGNOSIS — S060X0A Concussion without loss of consciousness, initial encounter: Secondary | ICD-10-CM | POA: Diagnosis not present

## 2016-01-11 DIAGNOSIS — Z7901 Long term (current) use of anticoagulants: Secondary | ICD-10-CM | POA: Diagnosis not present

## 2016-01-11 DIAGNOSIS — Z66 Do not resuscitate: Secondary | ICD-10-CM | POA: Diagnosis not present

## 2016-01-11 DIAGNOSIS — A419 Sepsis, unspecified organism: Secondary | ICD-10-CM | POA: Diagnosis not present

## 2016-01-11 DIAGNOSIS — M1990 Unspecified osteoarthritis, unspecified site: Secondary | ICD-10-CM | POA: Diagnosis not present

## 2016-01-11 DIAGNOSIS — Z87891 Personal history of nicotine dependence: Secondary | ICD-10-CM | POA: Diagnosis not present

## 2016-01-11 DIAGNOSIS — I517 Cardiomegaly: Secondary | ICD-10-CM | POA: Diagnosis not present

## 2016-01-11 DIAGNOSIS — E785 Hyperlipidemia, unspecified: Secondary | ICD-10-CM | POA: Diagnosis not present

## 2016-01-11 DIAGNOSIS — E039 Hypothyroidism, unspecified: Secondary | ICD-10-CM | POA: Diagnosis not present

## 2016-01-11 DIAGNOSIS — D649 Anemia, unspecified: Secondary | ICD-10-CM | POA: Diagnosis not present

## 2016-01-11 DIAGNOSIS — Z4682 Encounter for fitting and adjustment of non-vascular catheter: Secondary | ICD-10-CM | POA: Diagnosis not present

## 2016-01-11 DIAGNOSIS — R918 Other nonspecific abnormal finding of lung field: Secondary | ICD-10-CM | POA: Diagnosis not present

## 2016-01-11 DIAGNOSIS — R6520 Severe sepsis without septic shock: Secondary | ICD-10-CM | POA: Diagnosis not present

## 2016-01-11 DIAGNOSIS — Z515 Encounter for palliative care: Secondary | ICD-10-CM | POA: Diagnosis not present

## 2016-01-11 DIAGNOSIS — J449 Chronic obstructive pulmonary disease, unspecified: Secondary | ICD-10-CM | POA: Diagnosis not present

## 2016-01-11 DIAGNOSIS — J9622 Acute and chronic respiratory failure with hypercapnia: Secondary | ICD-10-CM | POA: Diagnosis not present

## 2016-01-11 DIAGNOSIS — I088 Other rheumatic multiple valve diseases: Secondary | ICD-10-CM | POA: Diagnosis not present

## 2016-01-11 DIAGNOSIS — J441 Chronic obstructive pulmonary disease with (acute) exacerbation: Secondary | ICD-10-CM | POA: Diagnosis not present

## 2016-01-11 DIAGNOSIS — J189 Pneumonia, unspecified organism: Secondary | ICD-10-CM | POA: Diagnosis not present

## 2016-01-11 DIAGNOSIS — R570 Cardiogenic shock: Secondary | ICD-10-CM | POA: Diagnosis not present

## 2016-01-11 DIAGNOSIS — J9601 Acute respiratory failure with hypoxia: Secondary | ICD-10-CM | POA: Diagnosis not present

## 2016-01-11 DIAGNOSIS — N390 Urinary tract infection, site not specified: Secondary | ICD-10-CM | POA: Diagnosis not present

## 2016-01-11 DIAGNOSIS — J9600 Acute respiratory failure, unspecified whether with hypoxia or hypercapnia: Secondary | ICD-10-CM | POA: Diagnosis not present

## 2016-01-11 DIAGNOSIS — I130 Hypertensive heart and chronic kidney disease with heart failure and stage 1 through stage 4 chronic kidney disease, or unspecified chronic kidney disease: Secondary | ICD-10-CM | POA: Diagnosis not present

## 2016-01-11 DIAGNOSIS — J811 Chronic pulmonary edema: Secondary | ICD-10-CM | POA: Diagnosis not present

## 2016-01-11 DIAGNOSIS — N183 Chronic kidney disease, stage 3 (moderate): Secondary | ICD-10-CM | POA: Diagnosis not present

## 2016-01-11 DIAGNOSIS — J9621 Acute and chronic respiratory failure with hypoxia: Secondary | ICD-10-CM | POA: Diagnosis not present

## 2016-01-11 DIAGNOSIS — J9602 Acute respiratory failure with hypercapnia: Secondary | ICD-10-CM | POA: Diagnosis not present

## 2016-01-12 DIAGNOSIS — R0603 Acute respiratory distress: Secondary | ICD-10-CM | POA: Diagnosis not present

## 2016-01-12 DIAGNOSIS — I97121 Postprocedural cardiac arrest following other surgery: Secondary | ICD-10-CM | POA: Diagnosis not present

## 2016-01-12 DIAGNOSIS — J9811 Atelectasis: Secondary | ICD-10-CM | POA: Diagnosis not present

## 2016-01-12 DIAGNOSIS — Z4682 Encounter for fitting and adjustment of non-vascular catheter: Secondary | ICD-10-CM | POA: Diagnosis not present

## 2016-01-12 DIAGNOSIS — J189 Pneumonia, unspecified organism: Secondary | ICD-10-CM | POA: Diagnosis not present

## 2016-01-12 DIAGNOSIS — R918 Other nonspecific abnormal finding of lung field: Secondary | ICD-10-CM | POA: Diagnosis not present

## 2016-01-13 DIAGNOSIS — R0689 Other abnormalities of breathing: Secondary | ICD-10-CM | POA: Diagnosis not present

## 2016-01-13 DIAGNOSIS — R918 Other nonspecific abnormal finding of lung field: Secondary | ICD-10-CM | POA: Diagnosis not present

## 2016-01-21 DEATH — deceased

## 2016-11-06 IMAGING — CR DG CHEST 1V PORT
1 series · 1 of 1 positions shown · non-contrast
Comparison: Chest radiograph performed 12/06/2013

CLINICAL DATA: Acute onset of worsening shortness of breath and
severe generalized weakness. Initial encounter.

EXAM:
PORTABLE CHEST 1 VIEW

[AP]
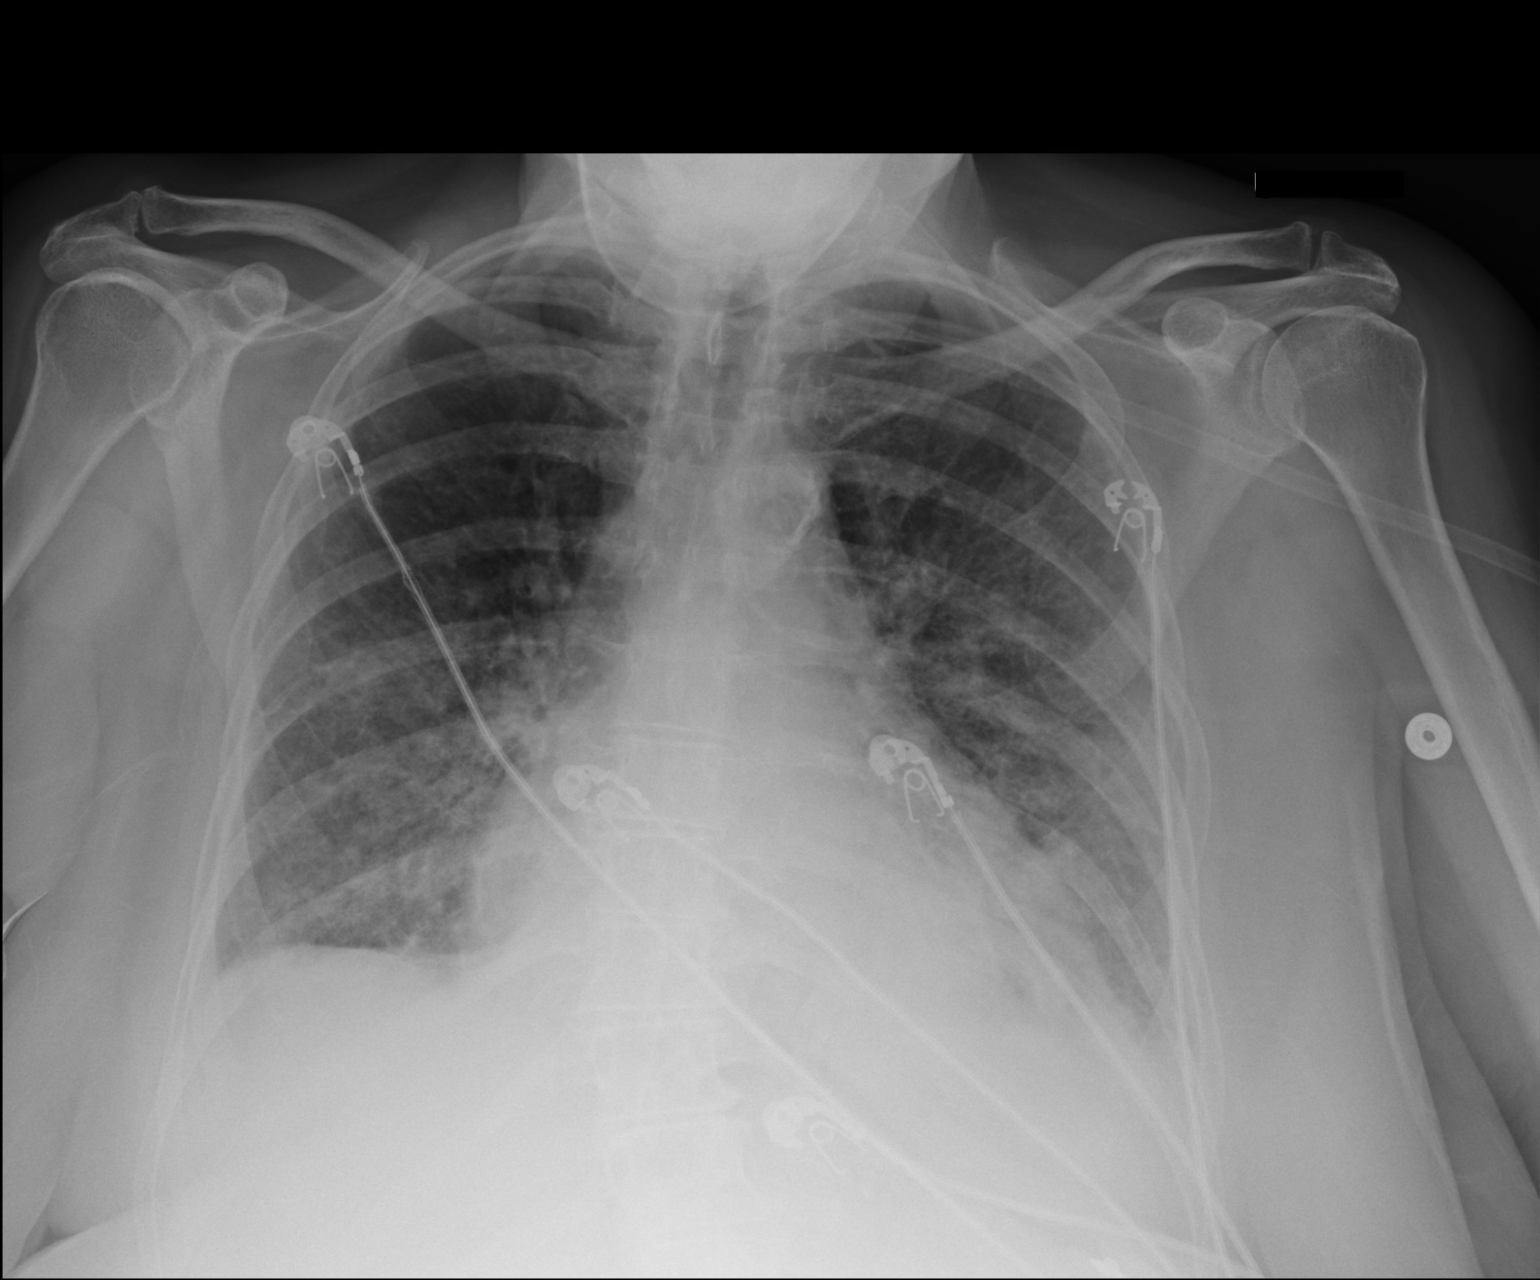

[1 of 1 positions shown; findings below may reference images not displayed]

FINDINGS: The lungs are relatively well expanded. Patchy bibasilar airspace
opacities may reflect pulmonary edema or pneumonia. No definite
pleural effusion or pneumothorax is seen. Vascular congestion is
noted.

The cardiomediastinal silhouette is mildly enlarged. No acute
osseous abnormalities are identified.
IMPRESSION: Vascular congestion and mild cardiomegaly. Patchy bibasilar airspace
opacities may reflect pulmonary edema or pneumonia.

## 2017-08-04 IMAGING — US US RENAL
1 series · 14 of 25 positions shown · non-contrast
Comparison: None.

CLINICAL DATA: Renal insufficiency

EXAM:
RENAL / URINARY TRACT ULTRASOUND COMPLETE

[Series 1: us renal · 0.21mm/px · 14 of 46 slices shown]
[im 1/46]
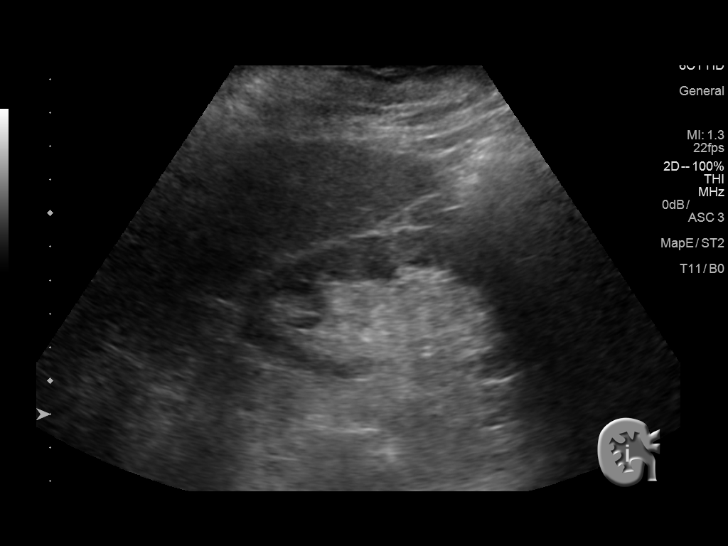
[im 4/46]
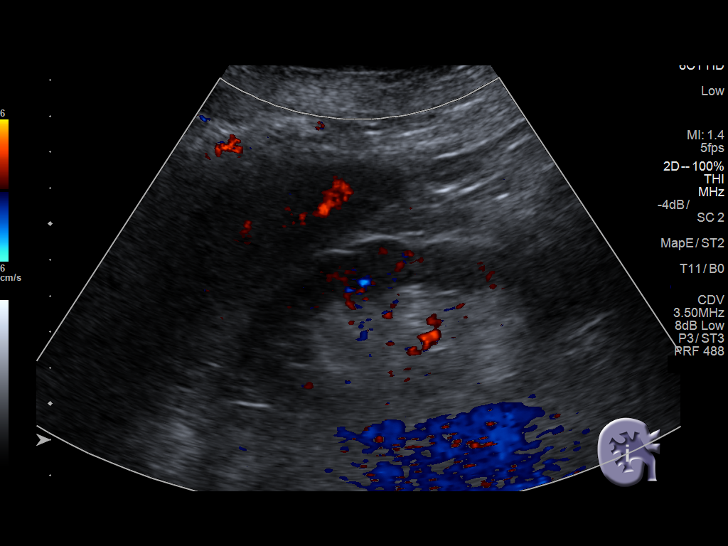
[im 8/46]
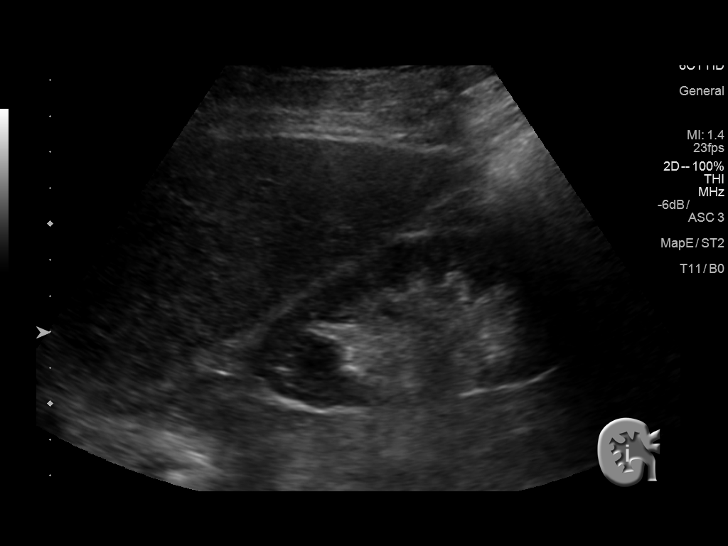
[im 12/46]
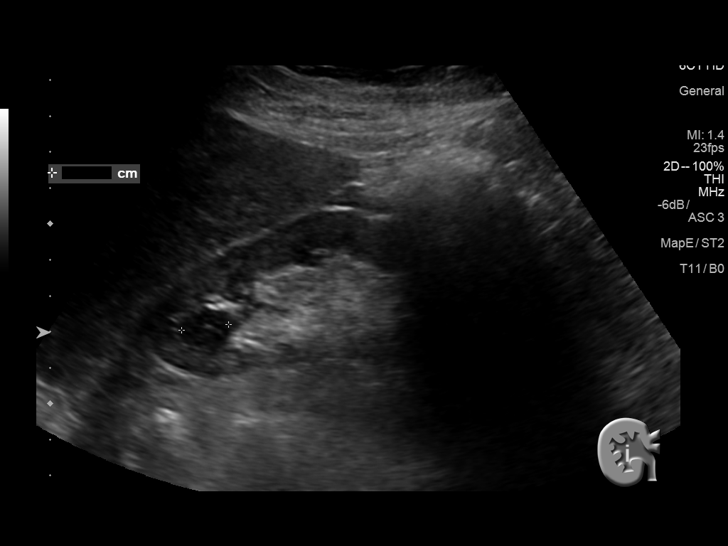
[im 16/46]
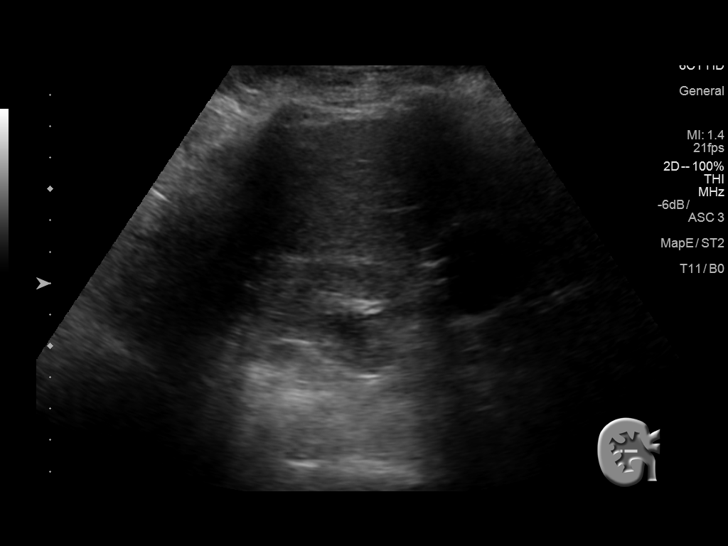
[im 17/46]
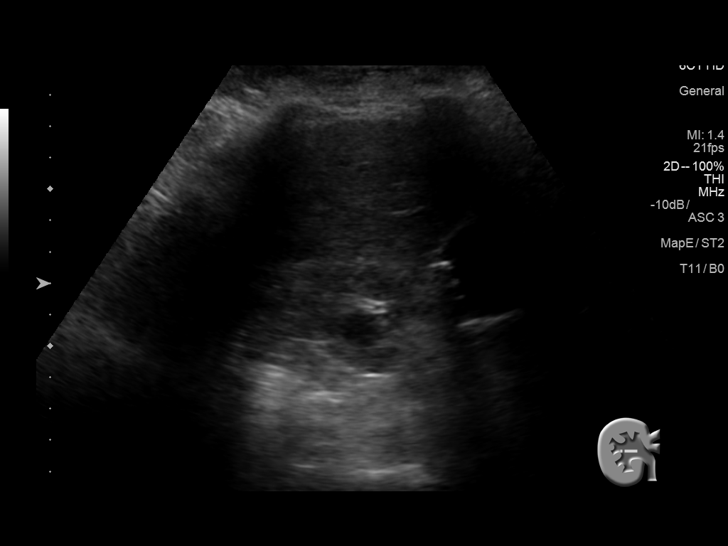
[im 21/46]
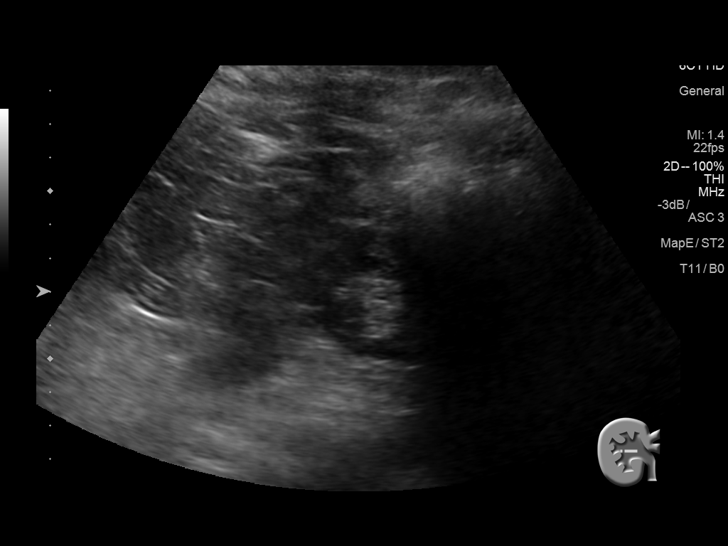
[im 25/46]
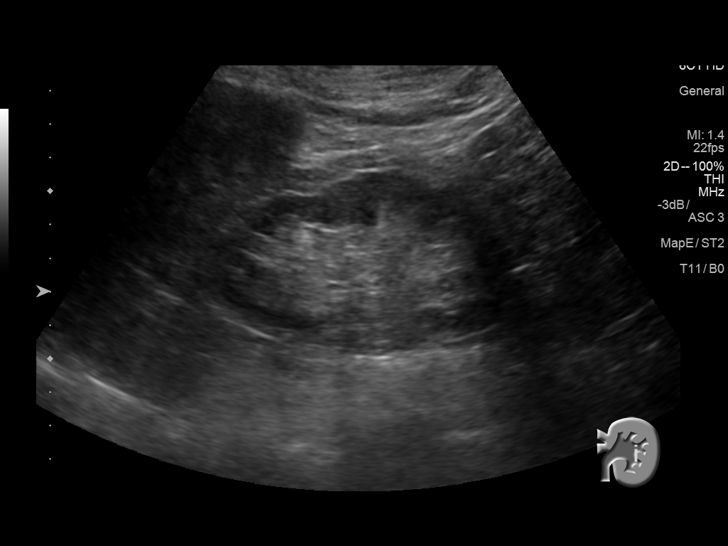
[im 29/46]
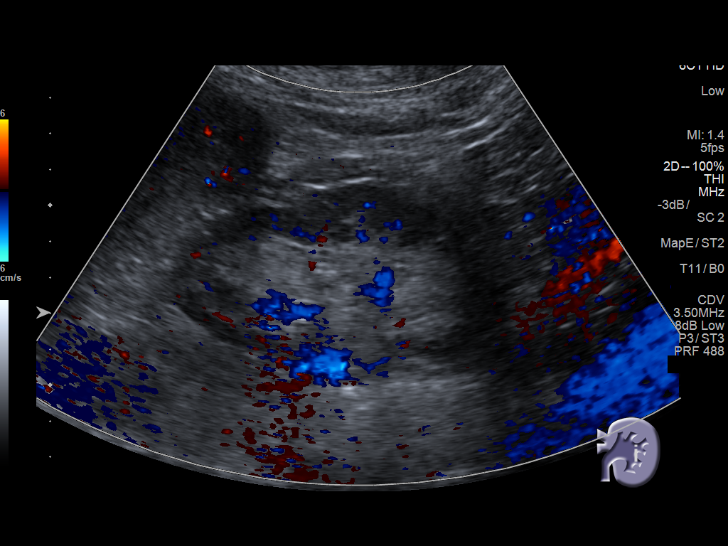
[im 31/46]
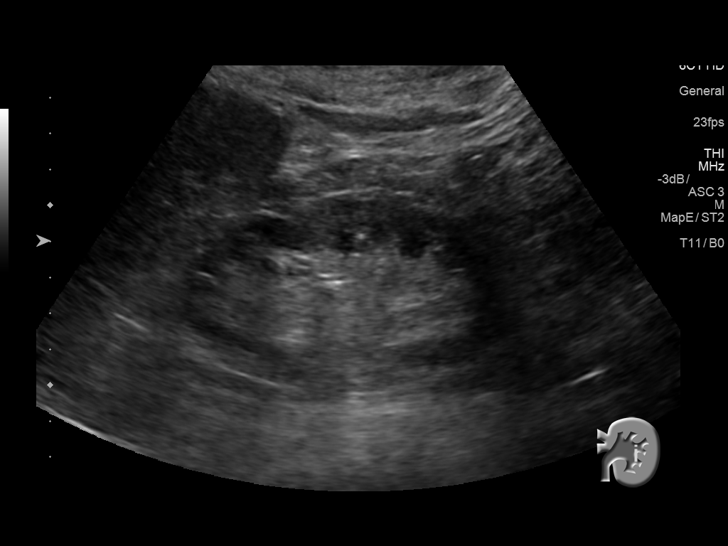
[im 34/46]
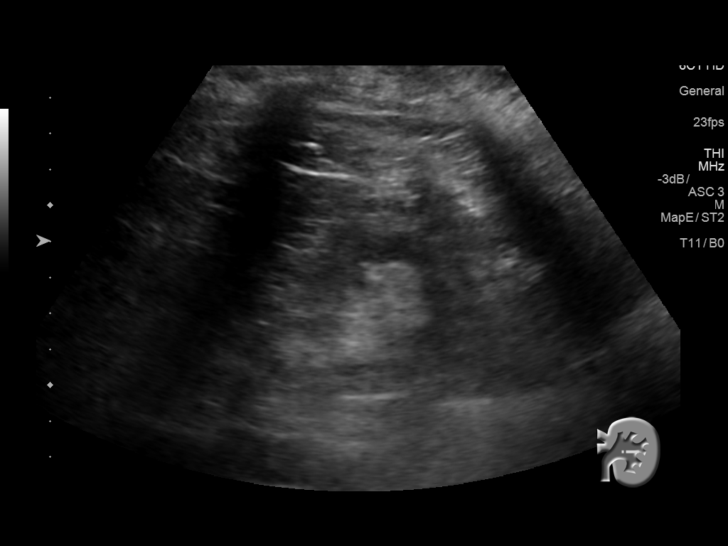
[im 38/46]
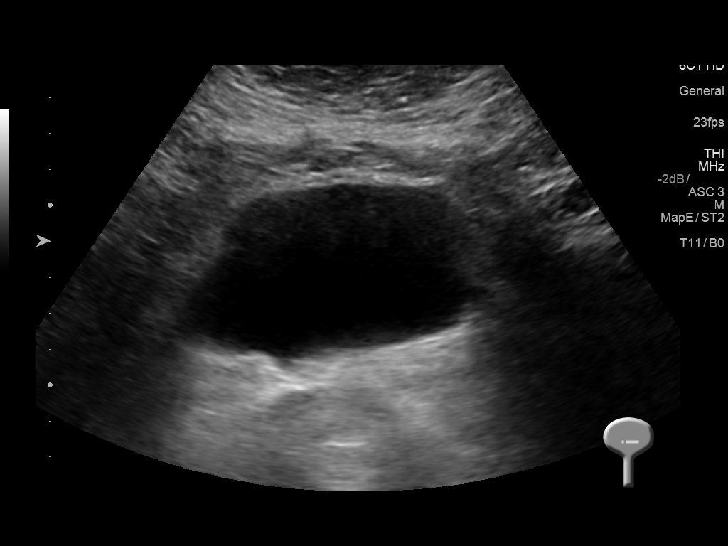
[im 42/46]
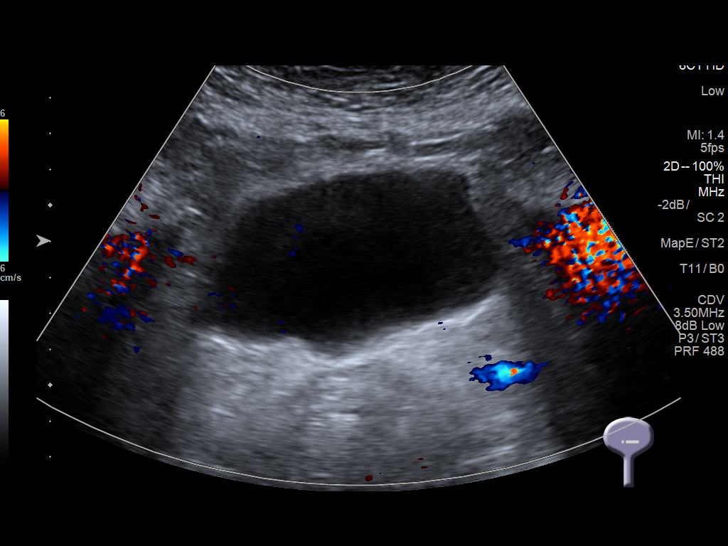
[im 46/46]
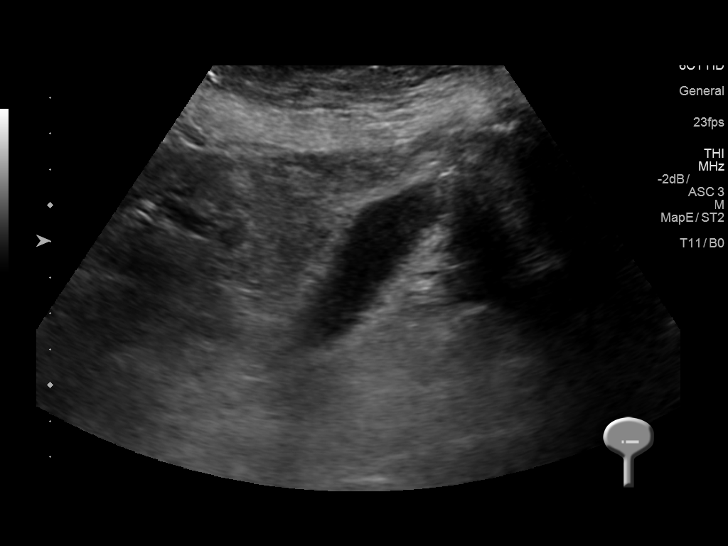

[14 of 25 positions shown; findings below may reference images not displayed]

FINDINGS: Right Kidney:

Length: 9.4 cm. Echogenicity within normal limits. There is mild
renal cortical thinning. No mass, perinephric fluid, or
hydronephrosis visualized. No sonographically demonstrable calculus
or ureterectasis. There is a relatively hypoechoic renal pyramid in
the upper pole of the right kidney which appears benign.

Left Kidney:

Length: 9.6 cm. Echogenicity within normal limits. There is mild
renal cortical thinning. No mass, perinephric fluid, or
hydronephrosis visualized. There is a calculus in the upper pole of
the left kidney measuring 5 mm. No ureterectasis.

Bladder:

Appears normal for degree of bladder distention.
IMPRESSION: Nonobstructing 5 mm calculus upper pole left kidney. Renal cortical
thinning bilaterally, a finding that may be indicative of medical
renal disease. The renal echogenicity bilaterally is normal, and
there is no hydronephrosis on either side.
